# Patient Record
Sex: Male | Born: 1990 | Race: Black or African American | Hispanic: No | Marital: Married | Smoking: Never smoker
Health system: Southern US, Community
[De-identification: ages and names within clinical notes are randomized; demographics above are authoritative.]

## PROBLEM LIST (undated history)

## (undated) DIAGNOSIS — N200 Calculus of kidney: Secondary | ICD-10-CM

## (undated) HISTORY — DX: Calculus of kidney: N20.0

---
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
## 2014-07-09 ENCOUNTER — Emergency Department (HOSPITAL_COMMUNITY): Payer: Self-pay

## 2014-07-09 ENCOUNTER — Encounter (HOSPITAL_COMMUNITY): Payer: Self-pay | Admitting: Emergency Medicine

## 2014-07-09 ENCOUNTER — Emergency Department (HOSPITAL_COMMUNITY)
Admission: EM | Admit: 2014-07-09 | Discharge: 2014-07-09 | Disposition: A | Discharge status: Home / Self Care | Payer: Self-pay | Attending: Emergency Medicine | Admitting: Emergency Medicine

## 2014-07-09 DIAGNOSIS — Y92838 Other recreation area as the place of occurrence of the external cause: Secondary | ICD-10-CM

## 2014-07-09 DIAGNOSIS — W219XXA Striking against or struck by unspecified sports equipment, initial encounter: Secondary | ICD-10-CM | POA: Insufficient documentation

## 2014-07-09 DIAGNOSIS — S6992XA Unspecified injury of left wrist, hand and finger(s), initial encounter: Secondary | ICD-10-CM

## 2014-07-09 DIAGNOSIS — Y9367 Activity, basketball: Secondary | ICD-10-CM | POA: Insufficient documentation

## 2014-07-09 DIAGNOSIS — S6980XA Other specified injuries of unspecified wrist, hand and finger(s), initial encounter: Secondary | ICD-10-CM | POA: Insufficient documentation

## 2014-07-09 DIAGNOSIS — Y9239 Other specified sports and athletic area as the place of occurrence of the external cause: Secondary | ICD-10-CM | POA: Insufficient documentation

## 2014-07-09 DIAGNOSIS — S6990XA Unspecified injury of unspecified wrist, hand and finger(s), initial encounter: Secondary | ICD-10-CM | POA: Insufficient documentation

## 2014-07-09 MED ORDER — IBUPROFEN 800 MG PO TABS: 800.0000 mg | ORAL_TABLET | Freq: Three times a day (TID) | ORAL | Status: DC

## 2014-07-09 NOTE — Discharge Instructions (Signed)
Take ibuprofen as needed for pain. Rest, ice and elevate the left hand for pain relief. Refer to attached documents for more information.

## 2014-07-09 NOTE — ED Provider Notes (Signed)
CSN: [id number]     Arrival date & time 07/09/14  0940 History  This chart was scribed for non-physician practitioner Emilia Beck, PA-C working with Doug Sou, MD by Leone Payor, ED Scribe. This patient was seen in room TR09C/TR09C and the patient's care was started at 10:10 AM.    Chief Complaint  Patient presents with  . thumb pain     The history is provided by the patient. No language interpreter was used.    HPI Comments: Douglas Frank is a 23 y.o. male who presents to the Emergency Department complaining of a left thumb injury that occurred yesterday. Patient states he was playing basketball when he hyperextended his left thumb. He reports constant, unchanged left hand pain which is worse with movement. He denies weakness and numbness. He states his job requires him to lift boxes and his employer suggested he be evaluated and get a work note for today.   History reviewed. No pertinent past medical history. History reviewed. No pertinent past surgical history. No family history on file. History  Substance Use Topics  . Smoking status: Never Smoker   . Smokeless tobacco: Not on file  . Alcohol Use: Yes    Review of Systems  Musculoskeletal: Positive for arthralgias.  Neurological: Negative for weakness and numbness.  All other systems reviewed and are negative.     Allergies  Review of patient's allergies indicates no known allergies.  Home Medications   Prior to Admission medications   Not on File   BP 122/68  Pulse 74  Temp(Src) 98.4 F (36.9 C) (Oral)  Resp 18  Ht  (1.727 m)  Wt 150 lb (68.04 kg)  BMI 22.81 kg/m2  SpO2 98% Physical Exam  Nursing note and vitals reviewed. Constitutional: He is oriented to person, place, and time. He appears well-developed and well-nourished.  HENT:  Head: Normocephalic and atraumatic.  Cardiovascular: Normal rate.   Pulmonary/Chest: Effort normal.  Abdominal: He exhibits no distension.   Musculoskeletal:  Tenderness and swelling to the left thenar aspect of left hand. No obvious deformity. Slightly limited ROM of the left thumb due to pain. No left thumb tenderness to palpation.   Neurological: He is alert and oriented to person, place, and time.  Left thumb sensation intact.   Skin: Skin is warm and dry.  Psychiatric: He has a normal mood and affect.    ED Course  Procedures (including critical care time)  DIAGNOSTIC STUDIES: Oxygen Saturation is 98% on RA, normal by my interpretation.    COORDINATION OF CARE: 10:12 AM Will order XRAY of left hand. Discussed treatment plan with pt at bedside and pt agreed to plan.   Labs Review Labs Reviewed - No data to display  Imaging Review Dg Hand Complete Left  07/09/2014   CLINICAL DATA:  Injury to the left hand and thumb while playing ball last night.  EXAM: LEFT HAND - COMPLETE 3+ VIEW  COMPARISON:  None.  FINDINGS: There is subtle radiolucency at the base of the first distal phalanx, if the patient has focal pain here, nondisplaced fracture is not excluded. There is no dislocation. No other fracture is identified. The soft tissues are normal P  IMPRESSION: There is subtle radiolucency at the base of the first distal phalanx, if the patient has focal pain here, nondisplaced fracture is not excluded.   Electronically Signed   By: Sherian Rein M.D.   On: 07/09/2014 10:20     EKG Interpretation None  MDM   Final diagnoses:  Thumb injury, left, initial encounter    Patient's xray shows no acute fracture. Patient is only tender at the left thenar aspect and not the distal portion of the digit. Patient instructed to rest, ice and elevate for symptom relief. Patient will have ibuprofen for pain.   I personally performed the services described in this documentation, which was scribed in my presence. The recorded information has been reviewed and is accurate.   Emilia Beck, PA-C 07/09/14 [address] PA-C 07/09/14 1120

## 2014-07-09 NOTE — ED Notes (Signed)
Patient states was playing basketball last night and hurt L thumb and bottom part of hand.   Patient states hes unsure if he broke.  Swelling and pain.

## 2014-07-09 NOTE — ED Provider Notes (Signed)
Medical screening examination/treatment/procedure(s) were performed by non-physician practitioner and as supervising physician I was immediately available for consultation/collaboration.   EKG Interpretation None       Doug Sou, MD 07/09/14 1616

## 2016-11-20 ENCOUNTER — Ambulatory Visit: Payer: Self-pay

## 2018-10-19 ENCOUNTER — Encounter: Payer: Self-pay | Admitting: Urology

## 2018-10-19 ENCOUNTER — Ambulatory Visit (INDEPENDENT_AMBULATORY_CARE_PROVIDER_SITE_OTHER): Payer: Self-pay | Admitting: Urology

## 2018-10-19 VITALS — BP 119/85 | HR 72 | Ht 68.0 in | Wt 168.5 lb

## 2018-10-19 DIAGNOSIS — M79652 Pain in left thigh: Secondary | ICD-10-CM

## 2018-10-19 NOTE — Progress Notes (Signed)
10/19/2018 2:01 PM   Douglas Frank 11/04/1990 [id number]  Referring provider: No referring provider defined for this encounter.  Chief Complaint  Patient presents with  . Establish Care  . Groin Pain    HPI: 28 year old male presents for evaluation of a "strained groin".  He works for [employer] and in September 2019 states he picked up a heavy box and while shifting to a different position had pain in his left posterior upper thigh region.  His pain is worse with prolonged standing and walking.  He has been treated by his PCP with meloxicam and Robaxin but has dull persistent symptoms.  He has no voiding complaints.  No previous history of urologic problems.   PMH: No past medical history on file.  Surgical History: Past Surgical History:  Procedure Laterality Date  . COLONOSCOPY  2011    Home Medications:  Allergies as of 10/19/2018   No Known Allergies     Medication List       Accurate as of October 19, 2018  2:01 PM. Always use your most recent med list.        ibuprofen 800 MG tablet Commonly known as:  ADVIL,MOTRIN Take 1 tablet (800 mg total) by mouth 3 (three) times daily.   MELOXICAM PO Take by mouth.   METHOCARBAMOL PO Take by mouth.       Allergies: No Known Allergies  Family History: Family History  Problem Relation Age of Onset  . Prostate cancer Neg Hx   . Kidney cancer Neg Hx   . Kidney failure Neg Hx   . Sickle cell anemia Neg Hx   . Tuberculosis Neg Hx     Social History:  reports that he has never smoked. He has never used smokeless tobacco. He reports current alcohol use. He reports that he does not use drugs.  ROS: UROLOGY Frequent Urination?: No Hard to postpone urination?: No Burning/pain with urination?: No Get up at night to urinate?: No Leakage of urine?: No Urine stream starts and stops?: No Trouble starting stream?: No Do you have to strain to urinate?: No Blood in urine?: No Urinary tract infection?: No Sexually  transmitted disease?: No Injury to kidneys or bladder?: No Painful intercourse?: No Weak stream?: No Erection problems?: No Penile pain?: Yes  Gastrointestinal Nausea?: No Vomiting?: No Indigestion/heartburn?: No Diarrhea?: No Constipation?: No  Constitutional Fever: No Night sweats?: No Weight loss?: No Fatigue?: No  Skin Skin rash/lesions?: No Itching?: No  Eyes Blurred vision?: No Double vision?: No  Ears/Nose/Throat Sore throat?: No Sinus problems?: No  Hematologic/Lymphatic Swollen glands?: No Easy bruising?: No  Cardiovascular Leg swelling?: No Chest pain?: No  Respiratory Cough?: No Shortness of breath?: No  Endocrine Excessive thirst?: No  Musculoskeletal Back pain?: No Joint pain?: No  Neurological Headaches?: Yes Dizziness?: Yes  Psychologic Depression?: No Anxiety?: No  Physical Exam: BP 119/85 (BP Location: Left Arm, Patient Position: Sitting, Cuff Size: Normal)   Pulse 72   Ht 5\' 8"  (1.727 m)   Wt 168 lb 8 oz (76.4 kg)   BMI 25.62 kg/m   Constitutional:  Alert and oriented, No acute distress. HEENT: [place] AT, moist mucus membranes.  Trachea midline, no masses. Cardiovascular: No clubbing, cyanosis, or edema. Respiratory: Normal respiratory effort, no increased work of breathing. GI: Abdomen is soft, nontender, nondistended, no abdominal masses GU: No CVA tenderness.  Penis circumcised without lesions.  Testes descended bilaterally without masses or tenderness.  Cord/epididymis palpably normal. Skin: No rashes, bruises or suspicious lesions.  Musculoskeletal: The region of his discomfort is in the upper medial thigh posteriorly.  No palpable tenderness or mass. Neurologic: Grossly intact, no focal deficits, moving all 4 extremities. Psychiatric: Normal mood and affect.   Assessment & Plan:   28 year old male with musculoskeletal pain that has been persistent.  No significant improvement with muscle relaxants and NSAIDs.  I  informed him that urologists typically do not treat musculoskeletal symptoms in this area.  I did put in a physical therapy referral and if he has persistent symptoms would recommend he see an orthopedic specialist.  Follow-up as needed.   Riki Altes, MD  [facility] [address] [place], [place] [address] [phone]

## 2018-11-09 ENCOUNTER — Telehealth: Payer: Self-pay | Admitting: Urology

## 2018-11-09 ENCOUNTER — Encounter: Payer: Self-pay | Admitting: Physical Therapy

## 2018-11-09 ENCOUNTER — Ambulatory Visit: Payer: 59 | Attending: Urology | Admitting: Physical Therapy

## 2018-11-09 ENCOUNTER — Other Ambulatory Visit: Payer: Self-pay

## 2018-11-09 DIAGNOSIS — M25551 Pain in right hip: Secondary | ICD-10-CM

## 2018-11-09 DIAGNOSIS — R252 Cramp and spasm: Secondary | ICD-10-CM

## 2018-11-09 DIAGNOSIS — M6281 Muscle weakness (generalized): Secondary | ICD-10-CM | POA: Diagnosis present

## 2018-11-09 NOTE — Telephone Encounter (Signed)
Received a call today from [place] at [facility] PT dept. She stated that the patient told them that his pain was in his right thigh not his left as the chart notes stated. She asked that I change the referral to reflect this. I changed it per her request. Just wanted to make you aware. Nothing else needs to be done, just wanted you to know.   Thanks, Marcelino Duster

## 2018-11-09 NOTE — Therapy (Addendum)
[facility] [address] [place], [place], [address] Phone: [phone]   Fax:  [phone]  Physical Therapy Evaluation  Patient Details  Name: Douglas Frank MRN: [id number] Date of Birth: May 25, 1991 Referring Provider (PT): Dr. Irineo Axon   Encounter Date: 11/09/2018  PT End of Session - 11/09/18 1201    Visit Number  1    Date for PT Re-Evaluation  12/21/18    Authorization Type  [employer]    PT Start Time  1100    PT Stop Time  1155    PT Time Calculation (min)  55 min    Activity Tolerance  Patient tolerated treatment well;No increased pain    Behavior During Therapy  [facility] for tasks assessed/performed       History reviewed. No pertinent past medical history.  Past Surgical History:  Procedure Laterality Date  . COLONOSCOPY  2011    There were no vitals filed for this visit.   Subjective Assessment - 11/09/18 1106    Subjective  Patient reports he started to have pain 06/2018 while lifting a 60# box. Patient picked it up and twisted spine while throwing it into the right place. Patient was checked for a hernia and cleared. Patient will be seeing a general surgeon for the pain.  Intermittent sharp pain into penis and down right side.     Patient Stated Goals  reduce pain, go back to gym, lift things,     Currently in Pain?  Yes    Pain Score  4     Pain Location  Groin    Pain Orientation  Right    Pain Descriptors / Indicators  Tightness    Pain Type  Acute pain    Pain Onset  More than a month ago    Pain Frequency  Intermittent    Aggravating Factors   sitting long period, walking, lifting, laying on stomach, laying on either side    Pain Relieving Factors  laying on back    Multiple Pain Sites  No         [facility] PT Assessment - 11/09/18 0001      Assessment   Medical Diagnosis  [id number] Musculoskeletal pain of right thigh    Referring Provider (PT)  Dr. Irineo Axon    Onset  Date/Surgical Date  06/18/18    Prior Therapy  none      Precautions   Precautions  None      Restrictions   Weight Bearing Restrictions  No      Balance Screen   Has the patient fallen in the past 6 months  No    Has the patient had a decrease in activity level because of a fear of falling?   No    Is the patient reluctant to leave their home because of a fear of falling?   No      Home Public house manager residence      Prior Function   Level of Independence  Independent    Vocation  Full time employment    Vocation Requirements  sitting right now, past job was lifting    Leisure  basketball, gym      Cognition   Overall Cognitive Status  Within Functional Limits for tasks assessed      Posture/Postural Control   Posture/Postural Control  No significant limitations      ROM / Strength   AROM / PROM / Strength  AROM;PROM;Strength      AROM   Lumbar Flexion  full with slight deviation to the right    Lumbar Extension  decreased by 50%    Lumbar - Right Side Bend  decreased by 25%    Lumbar - Left Side Bend  decreased by 25%      PROM   Right Hip External Rotation   55    Right Hip Internal Rotation   20    Left Hip External Rotation   70    Left Hip Internal Rotation   30      Strength   Right Hip External Rotation   3+/5   pain in groin   Right Hip Internal Rotation  4/5    Right Hip ABduction  3+/5    Right Hip ADduction  3+/5   pain   Left Hip External Rotation  5/5    Left Hip Internal Rotation  5/5    Left Hip ABduction  4-/5    Left Hip ADduction  5/5      Palpation   Spinal mobility  decreased L4-L5 vertebrae    SI assessment   right ilium is anteriorly rotated, sacrum rotated left    Palpation comment  tenderness in the hip adductors right, right quadriceps                Objective measurements completed on examination: See above findings.      [facility] Adult PT Treatment/Exercise - 11/09/18 0001      Knee/Hip  Exercises: Stretches   Active Hamstring Stretch  Right;1 rep;30 seconds    Active Hamstring Stretch Limitations  with strap in supine; then hip adductor with a strap in supine    Hip Flexor Stretch  Right;1 rep;30 seconds    Hip Flexor Stretch Limitations  with strap in sidely      Manual Therapy   Manual Therapy  Soft tissue mobilization;Joint mobilization;Muscle Energy Technique    Joint Mobilization  mobilization to sacrum and L4-L5     Soft tissue mobilization  right quadriceps and hip adductors    Muscle Energy Technique  correct right ilium       Trigger Point Dry Needling - 11/09/18 1158    Consent Given?  Yes    Education Handout Provided  Yes    Muscles Treated Lower Body  Adductor longus/brevius/maximus;Quadriceps   right   Quadriceps Response  Twitch response elicited;Palpable increased muscle length    Adductor Response  Twitch response elicited;Palpable increased muscle length           PT Education - 11/09/18 1157    Education Details  Access Code: [email or web address] ; information on dry needling    Person(s) Educated  Patient    Methods  Explanation;Demonstration;Verbal cues;Handout    Comprehension  Returned demonstration;Verbalized understanding       PT Short Term Goals - 11/09/18 1207      PT SHORT TERM GOAL #1   Title  Patient independent with initial HEP    Time  3    Period  Weeks    Status  New    Target Date  11/30/18      PT SHORT TERM GOAL #2   Title  ability to walk without a limp due to pelvis in correct alignment and pain decreased >/= 25%    Time  3    Period  Weeks    Status  New    Target Date  11/30/18  PT Long Term Goals - 11/09/18 1208      PT LONG TERM GOAL #1   Title  independent with HEP and understand how to progress himself at the gym    Time  6    Period  Weeks    Status  New    Target Date  12/21/18      PT LONG TERM GOAL #2   Title  able to lift items with correct body mechanics with pain due to reduction of  trigger points in the hip adductors and quadricep    Time  3    Period  Weeks    Status  New    Target Date  12/21/18      PT LONG TERM GOAL #3   Title  full lumbar ROM so patient is able to lay on his stomach and side without pain    Time  6    Period  Weeks    Status  New    Target Date  12/21/18      PT LONG TERM GOAL #4   Title  bilateral hip strength is >/=4/5 so patient is able to walk without pain or limp    Time  6    Period  Weeks    Status  New    Target Date  12/21/18      PT LONG TERM GOAL #5   Title  sit without difficulty or pain due to improved fascial restrictions in the right thigh    Time  6    Period  Weeks    Status  New    Target Date  12/21/18             Plan - 11/09/18 1201    Clinical Impression Statement  Patient is a 28 year old male with right groin pain since 06/2018. Patient reports he was picking up a 60# box when he twisted to place box in proper place. Patient started to have right groin pain at level 4/10. Patient reports his pain is worse with laying on his side or stomach, sitting and walking, and lifting. Patient has difficulty with walking due to limp on the right.  Right ilium is rotated anteriorly and sacrum rotated left. L4-L5 has decreased mobility. Patient has palpable tendenress located on the right hip adductors, quadrieps. Patient has weakness in bilateral hips. Patient has decreased ROM of right hip for rotation. Lumbar ROM is limited with trunk flexion deviates to the right. Patient will benefit from skilled therapy to improve ROM, decreased pain, reduce fascial restrictions to restore function.     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     Clinical Decision Making  Low    Rehab Potential  Excellent    Clinical Impairments Affecting Rehab Potential   none    PT Frequency  2x / week    PT Duration  6 weeks    PT Treatment/Interventions  Iontophoresis 4mg /ml Dexamethasone;Moist Heat;Ultrasound;Cryotherapy;Gait training;Therapeutic activities;Therapeutic exercise;Neuromuscular re-education;Manual techniques;Patient/family education;Passive range of motion;Dry needling    PT Next Visit Plan  assess dry needling, see if ilium in correct position, strength of right hip, joint mobilization of right hip, hip ER stretches    PT Home Exercise Plan  Access Code: [email or web address]    Consulted and Agree with  Plan of Care  Patient       Patient will benefit from skilled therapeutic intervention in order to improve the following deficits and impairments:  Increased fascial restricitons, Pain, Decreased mobility, Increased muscle spasms, Decreased activity tolerance, Decreased endurance, Decreased range of motion, Decreased strength, Difficulty walking  Visit Diagnosis: Pain in right hip - Plan: PT plan of care cert/re-cert  Muscle weakness (generalized) - Plan: PT plan of care cert/re-cert  Cramp and spasm - Plan: PT plan of care cert/re-cert     Problem List There are no active problems to display for this patient.   Eulis FosterCheryl Chablis Losh, PT 11/09/18 2:03 PM   [facility] [address], [address] [place], [place], [address] Phone: [phone]   Fax:  [phone]  Name: Douglas Frank MRN: [id number] Date of Birth: 02-01-1991

## 2018-11-09 NOTE — Telephone Encounter (Signed)
Noted, thanks!

## 2018-11-09 NOTE — Patient Instructions (Signed)
Access Code: [email or web address]  URL: [email or web address]  Date: 11/09/2018  Prepared by: Eulis Foster   Exercises  Hip Adductors and Hamstring Stretch with Strap - 2 reps - 1 sets - 30 sec hold - 1x daily - 7x weekly  Hooklying Hamstring Stretch with Strap - 2 reps - 1 sets - 30 sec hold - 2x daily - 7x weekly  Sidelying Quadriceps Stretch with Strap - 2 reps - 1 sets - 30 sec hold - 3x daily - 7x weekly  Patient Education  Trigger [facility] [address], [address] [place], [place] [address] Phone # [phone] Fax [phone]

## 2018-11-09 NOTE — Addendum Note (Signed)
Addended by: Eulis FosterGRAY, CHERYL F on: 11/09/2018 02:06 PM   Modules accepted: Orders

## 2018-11-13 ENCOUNTER — Encounter: Payer: Self-pay | Admitting: Physical Therapy

## 2018-11-13 ENCOUNTER — Ambulatory Visit: Payer: 59 | Admitting: Physical Therapy

## 2018-11-13 DIAGNOSIS — M6281 Muscle weakness (generalized): Secondary | ICD-10-CM

## 2018-11-13 DIAGNOSIS — M25551 Pain in right hip: Secondary | ICD-10-CM | POA: Diagnosis not present

## 2018-11-13 DIAGNOSIS — R252 Cramp and spasm: Secondary | ICD-10-CM

## 2018-11-13 NOTE — Patient Instructions (Signed)
Access Code: [email or web address]  URL: [email or web address]  Date: 11/13/2018  Prepared by: Eulis Foster   Exercises  Hip Adductors and Hamstring Stretch with Strap - 2 reps - 1 sets - 30 sec hold - 1x daily - 7x weekly  Hooklying Hamstring Stretch with Strap - 2 reps - 1 sets - 30 sec hold - 2x daily - 7x weekly  Sidelying Quadriceps Stretch with Strap - 2 reps - 1 sets - 30 sec hold - 3x daily - 7x weekly  Supine Piriformis Stretch Pulling Heel to Hip - 2 reps - 1 sets - 30 sec hold - 1x daily - 7x weekly  Seated Piriformis Stretch with Trunk Bend - 2 reps - 1 sets - 30 sec hold - 1x daily - 7x weekly  Clamshell - 20 reps - 1 sets - 1 sec hold - 1x daily - 7x weekly  Bridge - 15 reps - 1 sets - 5 sec hold - 1x daily - 7x weekly  Patient Education  Trigger [facility] [address] [place], [place] [address] Phone # [phone] Fax [phone]

## 2018-11-13 NOTE — Therapy (Signed)
[facility] [address], [address] [place], [place], [address] Phone: [phone]   Fax:  [phone]  Physical Therapy Treatment  Patient Details  Name: Douglas Frank MRN: [id number] Date of Birth: March 26, 1991 Referring Provider (PT): Dr. Irineo AxonScott Stoioff   Encounter Date: 11/13/2018  PT End of Session - 11/13/18 0804    Visit Number  2    Date for PT Re-Evaluation  12/21/18    Authorization Type  Aetna    PT Start Time  0800    PT Stop Time  0840    PT Time Calculation (min)  40 min    Activity Tolerance  Patient tolerated treatment well;No increased pain    Behavior During Therapy  [facility] for tasks assessed/performed       History reviewed. No pertinent past medical history.  Past Surgical History:  Procedure Laterality Date  . COLONOSCOPY  2011    There were no vitals filed for this visit.  Subjective Assessment - 11/13/18 0803    Subjective  The tightness is better. Patient wife has been massaging the area.     Patient Stated Goals  reduce pain, go back to gym, lift things,     Currently in Pain?  Yes    Pain Score  3     Pain Location  Groin    Pain Orientation  Right    Pain Descriptors / Indicators  Tightness    Pain Type  Acute pain    Pain Onset  More than a month ago    Pain Frequency  Intermittent    Aggravating Factors   sitting long period, walking, lifting, laying on stomach, laying on either side    Pain Relieving Factors  laying on back    Multiple Pain Sites  No                       [facility] Adult PT Treatment/Exercise - 11/13/18 0001      Exercises   Exercises  Knee/Hip      Knee/Hip Exercises: Aerobic   Stationary Bike  5 min level 5 while assessing patient    Tread Mill  walking 3 min at 2.5 mph and 3 min at 3 mph to correct gait      Manual Therapy   Manual Therapy  Soft tissue mobilization;Joint mobilization    Manual therapy comments  using addaday to right hip  adductors, hamstring and quads    Joint Mobilization  right hip for inferior glide, lateral glide and distraction       Trigger Point Dry Needling - 11/13/18 [id number]    Consent Given?  Yes    Muscles Treated Lower Body  Quadriceps;Adductor longus/brevius/maximus;Hamstring   right   Quadriceps Response  Twitch response elicited;Palpable increased muscle length    Adductor Response  Twitch response elicited;Palpable increased muscle length    Hamstring Response  Twitch response elicited;Palpable increased muscle length           PT Education - 11/13/18 0839    Education Details  Access Code: [email or web address]     Person(s) Educated  Patient    Methods  Explanation;Demonstration;Verbal cues;Handout    Comprehension  Returned demonstration;Verbalized understanding       PT Short Term Goals - 11/09/18 1207      PT SHORT TERM GOAL #1   Title  Patient independent with initial HEP    Time  3    Period  Weeks    Status  New    Target Date  11/30/18      PT SHORT TERM GOAL #2   Title  ability to walk without a limp due to pelvis in correct alignment and pain decreased >/= 25%    Time  3    Period  Weeks    Status  New    Target Date  11/30/18        PT Long Term Goals - 11/09/18 1208      PT LONG TERM GOAL #1   Title  independent with HEP and understand how to progress himself at the gym    Time  6    Period  Weeks    Status  New    Target Date  12/21/18      PT LONG TERM GOAL #2   Title  able to lift items with correct body mechanics with pain due to reduction of trigger points in the hip adductors and quadricep    Time  3    Period  Weeks    Status  New    Target Date  12/21/18      PT LONG TERM GOAL #3   Title  full lumbar ROM so patient is able to lay on his stomach and side without pain    Time  6    Period  Weeks    Status  New    Target Date  12/21/18      PT LONG TERM GOAL #4   Title  bilateral hip strength is >/=4/5 so patient is able to walk without pain or  limp    Time  6    Period  Weeks    Status  New    Target Date  12/21/18      PT LONG TERM GOAL #5   Title  sit without difficulty or pain due to improved fascial restrictions in the right thigh    Time  6    Period  Weeks    Status  New    Target Date  12/21/18            Plan - 11/13/18 0804    Clinical Impression Statement  Patient continues to have trigger points in the right quads, hamstring, and hip adductors. Patient able to walk without deficits when walking on the treadmill at 2.5 and 3 mph. Patient has pain with intercourse due to pain in right groin and hamstring. Patient will walk guarded on right hip. Patient had increased right hip ER after therapy. Patient is progressing his hip stretches and strength. Patient will benefit from skilled therapy to improve ROM, decreased pain, reduce fascial restrictions, reduce fascial restrictions to restore function.      Rehab Potential  Excellent    Clinical Impairments Affecting Rehab Potential  none    PT Frequency  2x / week    PT Duration  6 weeks    PT Treatment/Interventions  Iontophoresis 4mg /ml Dexamethasone;Moist Heat;Ultrasound;Cryotherapy;Gait training;Therapeutic activities;Therapeutic exercise;Neuromuscular re-education;Manual techniques;Patient/family education;Passive range of motion;Dry needling    PT Next Visit Plan   see if ilium in correct position, strength of right hip, joint mobilization of right hip, hip ER stretches; treadmill, stance strengthening,     PT Home Exercise Plan  Access Code: [email or web address]    Recommended Other Services  MD signed the initial note    Consulted and Agree with Plan of Care  Patient       Patient will benefit from skilled therapeutic intervention in order to improve  the following deficits and impairments:  Increased fascial restricitons, Pain, Decreased mobility, Increased muscle spasms, Decreased activity tolerance, Decreased endurance, Decreased range of motion, Decreased strength,  Difficulty walking  Visit Diagnosis: Pain in right hip  Muscle weakness (generalized)  Cramp and spasm     Problem List There are no active problems to display for this patient.   Eulis Foster, PT 11/13/18 8:47 AM   [facility] [address] [place], [place], [address] Phone: [phone]   Fax:  [phone]  Name: Douglas Frank MRN: [id number] Date of Birth: [birth year]

## 2018-11-15 ENCOUNTER — Encounter: Payer: Self-pay | Admitting: Physical Therapy

## 2018-11-15 ENCOUNTER — Ambulatory Visit: Payer: 59 | Admitting: Physical Therapy

## 2018-11-15 DIAGNOSIS — M6281 Muscle weakness (generalized): Secondary | ICD-10-CM

## 2018-11-15 DIAGNOSIS — R252 Cramp and spasm: Secondary | ICD-10-CM

## 2018-11-15 DIAGNOSIS — M25551 Pain in right hip: Secondary | ICD-10-CM

## 2018-11-15 NOTE — Therapy (Signed)
[facility] [address] [place], [place], [address] Phone: [phone]   Fax:  [phone]  Physical Therapy Treatment  Patient Details  Name: Douglas Frank MRN: [id number] Date of Birth: Apr 29, 1991 Referring Provider (PT): Dr. Irineo Axon   Encounter Date: 11/15/2018  PT End of Session - 11/15/18 1407    Visit Number  3    Date for PT Re-Evaluation  12/21/18    Authorization Type  Aetna    PT Start Time  1403    PT Stop Time  1451    PT Time Calculation (min)  48 min    Activity Tolerance  Patient tolerated treatment well;No increased pain    Behavior During Therapy  [facility] for tasks assessed/performed       History reviewed. No pertinent past medical history.  Past Surgical History:  Procedure Laterality Date  . COLONOSCOPY  2011    There were no vitals filed for this visit.  Subjective Assessment - 11/15/18 1408    Subjective  Pain is a constant, annoying, nagging ache.     Currently in Pain?  Yes    Pain Score  3     Pain Location  Groin    Pain Orientation  Right;Posterior    Pain Descriptors / Indicators  Aching    Multiple Pain Sites  No                       [facility] Adult PT Treatment/Exercise - 11/15/18 0001      Knee/Hip Exercises: Stretches   Active Hamstring Stretch  Right;3 reps;30 seconds   with strap   Other Knee/Hip Stretches  IR/ER stretch Rt 30 sec each     Other Knee/Hip Stretches  adductor stretch with strap 3x 30 sec      Knee/Hip Exercises: Aerobic   Stationary Bike  L2 x 5 min with concurrent review of status      Knee/Hip Exercises: Supine   Bridges  --   3x then stopped for pain, f/b SKC chest Bil   Knee Flexion Limitations  Isometric hip extension 3 sec holds x 8     Other Supine Knee/Hip Exercises  Ball squeezes for hip add: 5 sec hold 2x 5     Other Supine Knee/Hip Exercises  Hip abd with green band 2x5       Knee/Hip Exercises: Sidelying   Hip  ABduction  AROM;Strengthening;Right;2 sets   5 reps 2 sets     Cryotherapy   Number Minutes Cryotherapy  10 Minutes   post session   Cryotherapy Location  --   Posterior high hamstring and medial RT groin   Type of Cryotherapy  Ice pack               PT Short Term Goals - 11/09/18 1207      PT SHORT TERM GOAL #1   Title  Patient independent with initial HEP    Time  3    Period  Weeks    Status  New    Target Date  11/30/18      PT SHORT TERM GOAL #2   Title  ability to walk without a limp due to pelvis in correct alignment and pain decreased >/= 25%    Time  3    Period  Weeks    Status  New    Target Date  11/30/18        PT Long Term Goals -  11/09/18 1208      PT LONG TERM GOAL #1   Title  independent with HEP and understand how to progress himself at the gym    Time  6    Period  Weeks    Status  New    Target Date  12/21/18      PT LONG TERM GOAL #2   Title  able to lift items with correct body mechanics with pain due to reduction of trigger points in the hip adductors and quadricep    Time  3    Period  Weeks    Status  New    Target Date  12/21/18      PT LONG TERM GOAL #3   Title  full lumbar ROM so patient is able to lay on his stomach and side without pain    Time  6    Period  Weeks    Status  New    Target Date  12/21/18      PT LONG TERM GOAL #4   Title  bilateral hip strength is >/=4/5 so patient is able to walk without pain or limp    Time  6    Period  Weeks    Status  New    Target Date  12/21/18      PT LONG TERM GOAL #5   Title  sit without difficulty or pain due to improved fascial restrictions in the right thigh    Time  6    Period  Weeks    Status  New    Target Date  12/21/18            Plan - 11/15/18 1419    Clinical Impression Statement  Pt arrives from work ( not lifting) and he is sore from walking. We introduced light Rt hip strengthening today after performing his stretches encouraging pt to go slow and  work in pain free ROM. He could do this, but these basiic exercises made him pretty sore and we were not not able to progress to standing today. Used ice at end of treatment for soreness.     Rehab Potential  Excellent    Clinical Impairments Affecting Rehab Potential  none    PT Frequency  2x / week    PT Duration  6 weeks    PT Treatment/Interventions  Iontophoresis 4mg /ml Dexamethasone;Moist Heat;Ultrasound;Cryotherapy;Gait training;Therapeutic activities;Therapeutic exercise;Neuromuscular re-education;Manual techniques;Patient/family education;Passive range of motion;Dry needling    PT Next Visit Plan  If pt tolerated the beginning exercises today for strength ok, give for HEP next session.     PT Home Exercise Plan  Access Code: [email or web address]    Consulted and Agree with Plan of Care  Patient       Patient will benefit from skilled therapeutic intervention in order to improve the following deficits and impairments:  Increased fascial restricitons, Pain, Decreased mobility, Increased muscle spasms, Decreased activity tolerance, Decreased endurance, Decreased range of motion, Decreased strength, Difficulty walking  Visit Diagnosis: Pain in right hip  Muscle weakness (generalized)  Cramp and spasm     Problem List There are no active problems to display for this patient.   Douglas Frank, PTA 11/15/2018, 2:43 PM  [facility] [address] [place], [place], [address] Phone: [phone]   Fax:  [phone]  Name: Douglas Frank MRN: [id number] Date of Birth: Nov 14, 1990

## 2018-11-20 ENCOUNTER — Ambulatory Visit: Payer: 59 | Attending: Urology | Admitting: Physical Therapy

## 2018-11-20 ENCOUNTER — Encounter: Payer: Self-pay | Admitting: Physical Therapy

## 2018-11-20 DIAGNOSIS — M6281 Muscle weakness (generalized): Secondary | ICD-10-CM

## 2018-11-20 DIAGNOSIS — M25551 Pain in right hip: Secondary | ICD-10-CM | POA: Diagnosis present

## 2018-11-20 DIAGNOSIS — R252 Cramp and spasm: Secondary | ICD-10-CM | POA: Diagnosis present

## 2018-11-20 NOTE — Therapy (Signed)
[facility] [address] [place], [place], [address] Phone: [phone]   Fax:  [phone]  Physical Therapy Treatment  Patient Details  Name: Josie Mesa MRN: [id number] Date of Birth: [birth year] Referring Provider (PT): Dr. John Giovanni   Encounter Date: 11/20/2018  PT End of Session - 11/20/18 0807    Visit Number  4    Date for PT Re-Evaluation  12/21/18    Authorization Type  Aetna    PT Start Time  0804    PT Stop Time  9024    PT Time Calculation (min)  51 min    Activity Tolerance  Patient tolerated treatment well;No increased pain    Behavior During Therapy  [facility] for tasks assessed/performed       History reviewed. No pertinent past medical history.  Past Surgical History:  Procedure Laterality Date  . COLONOSCOPY  2011    There were no vitals filed for this visit.  Subjective Assessment - 11/20/18 0809    Subjective  I am doing better. I feel good this AM.    Currently in Pain?  No/denies    Multiple Pain Sites  No                       [facility] Adult PT Treatment/Exercise - 11/20/18 0001      Knee/Hip Exercises: Stretches   Active Hamstring Stretch  Right;3 reps;30 seconds   with strap, LTLE straight today   Other Knee/Hip Stretches  IR/ER stretch Rt 30 sec each    2 sets   Other Knee/Hip Stretches  adductor stretch with strap 3x 30 sec      Knee/Hip Exercises: Aerobic   Stationary Bike  L2 x 7 min   PTA present to discuss status     Knee/Hip Exercises: Supine   Bridges  Strengthening;Both;2 sets;10 reps    Bridges with [employer]  Strengthening;Both;1 set;10 reps    Knee Flexion Limitations  Ball squeezex 10x    Other Supine Knee/Hip Exercises  clamshells red 2x10      Knee/Hip Exercises: Sidelying   Hip ABduction  AROM;Strengthening;Right;2 sets;10 reps    Clams  red band 2x10      Cryotherapy   Number Minutes Cryotherapy  10 Minutes   post session   Cryotherapy Location  --   RT groin and proximal hamstrings   Type of Cryotherapy  Ice pack               PT Short Term Goals - 11/20/18 0973      PT SHORT TERM GOAL #1   Title  Patient independent with initial HEP    Time  3    Period  Weeks    Status  Achieved      PT SHORT TERM GOAL #2   Title  ability to walk without a limp due to pelvis in correct alignment and pain decreased >/= 25%    Time  3    Period  Weeks    Status  Achieved        PT Long Term Goals - 11/20/18 5329      PT LONG TERM GOAL #5   Title  sit without difficulty or pain due to improved fascial restrictions in the right thigh    Time  6    Period  Weeks    Status  On-going   minimal difficulty left  Plan - 11/20/18 0807    Clinical Impression Statement  Pt arrives today without pain and reports of feeling 75% better since his evaluaiton. Pt has met all short term goals this AM. Pt did very well with resisted hip work today, added red band to his clamshells and ball squeeze to his bridge for home.  Pt also able to increase his volume for all his hip strengthening exercises.    Rehab Potential  Excellent    Clinical Impairments Affecting Rehab Potential  none    PT Frequency  2x / week    PT Duration  6 weeks    PT Treatment/Interventions  Iontophoresis 30m/ml Dexamethasone;Moist Heat;Ultrasound;Cryotherapy;Gait training;Therapeutic activities;Therapeutic exercise;Neuromuscular re-education;Manual techniques;Patient/family education;Passive range of motion;Dry needling    PT Next Visit Plan  DN if pt & PT feels there is a need. MMT RT hip for goals and ROM.     PT Home Exercise Plan  Access Code: [email or web address]    Consulted and Agree with Plan of Care  Patient       Patient will benefit from skilled therapeutic intervention in order to improve the following deficits and impairments:  Increased fascial restricitons, Pain, Decreased mobility, Increased muscle spasms, Decreased activity  tolerance, Decreased endurance, Decreased range of motion, Decreased strength, Difficulty walking  Visit Diagnosis: Pain in right hip  Muscle weakness (generalized)  Cramp and spasm     Problem List There are no active problems to display for this patient.   Angely Dietz, PTA 11/20/2018, 8:46 AM  [facility] [address] [place] [address]Phone: [phone]  Fax:  [phone] Name: CReyes AldacoMRN: [id number] of Birth: 121-Dec-1992

## 2018-11-22 ENCOUNTER — Encounter: Payer: Self-pay | Admitting: Physical Therapy

## 2018-11-22 ENCOUNTER — Ambulatory Visit: Payer: 59 | Admitting: Physical Therapy

## 2018-11-22 DIAGNOSIS — M25551 Pain in right hip: Secondary | ICD-10-CM | POA: Diagnosis not present

## 2018-11-22 DIAGNOSIS — R252 Cramp and spasm: Secondary | ICD-10-CM

## 2018-11-22 DIAGNOSIS — M6281 Muscle weakness (generalized): Secondary | ICD-10-CM

## 2018-11-22 NOTE — Therapy (Signed)
[facility] [address], [address] [place], [place], [address] Phone: [phone]   Fax:  [phone]  Physical Therapy Treatment  Patient Details  Name: Douglas Frank MRN: [id number] Date of Birth: 08/08/1991 Referring Provider (PT): Dr. Irineo AxonScott Stoioff   Encounter Date: 11/22/2018  PT End of Session - 11/22/18 1526    Visit Number  5    Date for PT Re-Evaluation  12/21/18    Authorization Type  Aetna    PT Start Time  1445    PT Stop Time  1525    PT Time Calculation (min)  40 min    Activity Tolerance  Patient tolerated treatment well;No increased pain    Behavior During Therapy  [facility] for tasks assessed/performed       History reviewed. No pertinent past medical history.  Past Surgical History:  Procedure Laterality Date  . COLONOSCOPY  2011    There were no vitals filed for this visit.  Subjective Assessment - 11/22/18 1447    Subjective  I feel better. I feel stronger. My pain feels 70% better. I have been mentally. I feel like I need dry needling.     Patient Stated Goals  reduce pain, go back to gym, lift things,     Currently in Pain?  No/denies    Multiple Pain Sites  No         [facility] PT Assessment - 11/22/18 0001      AROM   Lumbar Flexion  full no deviation    Lumbar Extension  full    Lumbar - Right Side Bend  full    Lumbar - Left Side Bend  full      PROM   Right Hip External Rotation   70    Right Hip Internal Rotation   25      Strength   Right Hip External Rotation   5/5    Right Hip Internal Rotation  5/5    Left Hip External Rotation  5/5    Left Hip Internal Rotation  5/5      Palpation   Spinal mobility  full lumbar mobility    SI assessment   pelvis in correct alignment                   [facility] Adult PT Treatment/Exercise - 11/22/18 0001      Knee/Hip Exercises: Aerobic   Stationary Bike  L2 x 7 min   PT present to discuss status     Manual Therapy   Manual  Therapy  Soft tissue mobilization    Manual therapy comments  used Addaday to right hamstring and hip adductors    Soft tissue mobilization  manual soft tissue work to the right hip adductors and hamstring       Trigger Point Dry Needling - 11/22/18 1451    Consent Given?  Yes    Muscles Treated Lower Body  Hamstring;Adductor longus/brevius/maximus    Adductor Response  Twitch response elicited;Palpable increased muscle length    Hamstring Response  Twitch response elicited;Palpable increased muscle length             PT Short Term Goals - 11/22/18 1515      PT SHORT TERM GOAL #1   Title  Patient independent with initial HEP    Time  3    Period  Weeks    Status  Achieved      PT SHORT TERM GOAL #2   Title  ability to walk without a limp due to pelvis in correct alignment and pain decreased >/= 25%    Time  3    Period  Weeks    Status  Achieved        PT Long Term Goals - 11/20/18 9741      PT LONG TERM GOAL #5   Title  sit without difficulty or pain due to improved fascial restrictions in the right thigh    Time  6    Period  Weeks    Status  On-going   minimal difficulty left           Plan - 11/22/18 1526    Clinical Impression Statement  Patient had trigger points in the right hamstring and hip adductor and iliopsoas. Patient pelvis was in correct alignment. Patient has full right hip P/ROM. Patient hip rotator strength is now 5/5. Patient is not walking with a limp. Patient needs strengthening for the pelvis and hip for daily tasks. Patient has full lumbar ROM now. Patient will benefit from skilled therapy to improve strength and function.     Rehab Potential  Excellent    Clinical Impairments Affecting Rehab Potential  none    PT Frequency  2x / week    PT Duration  6 weeks    PT Treatment/Interventions  Iontophoresis 4mg /ml Dexamethasone;Moist Heat;Ultrasound;Cryotherapy;Gait training;Therapeutic activities;Therapeutic exercise;Neuromuscular  re-education;Manual techniques;Patient/family education;Passive range of motion;Dry needling    PT Next Visit Plan  pelvic stability exercises, right hip strength    PT Home Exercise Plan  Access Code: [email or web address]    Consulted and Agree with Plan of Care  Patient       Patient will benefit from skilled therapeutic intervention in order to improve the following deficits and impairments:  Increased fascial restricitons, Pain, Decreased mobility, Increased muscle spasms, Decreased activity tolerance, Decreased endurance, Decreased range of motion, Decreased strength, Difficulty walking  Visit Diagnosis: Pain in right hip  Muscle weakness (generalized)  Cramp and spasm     Problem List There are no active problems to display for this patient.   Eulis Foster, PT 11/22/18 3:29 PM   [facility] [address] [place], [place], [address] Phone: [phone]   Fax:  [phone]  Name: Douglas Frank MRN: [id number] Date of Birth: 1991-04-03

## 2018-11-27 ENCOUNTER — Ambulatory Visit: Payer: 59 | Admitting: Physical Therapy

## 2018-11-27 ENCOUNTER — Encounter: Payer: Self-pay | Admitting: Physical Therapy

## 2018-11-27 DIAGNOSIS — M6281 Muscle weakness (generalized): Secondary | ICD-10-CM

## 2018-11-27 DIAGNOSIS — R252 Cramp and spasm: Secondary | ICD-10-CM

## 2018-11-27 DIAGNOSIS — M25551 Pain in right hip: Secondary | ICD-10-CM

## 2018-11-27 NOTE — Therapy (Signed)
[facility] [address], [address] [place], [place], [address] Phone: [phone]   Fax:  [phone]  Physical Therapy Treatment  Patient Details  Name: Douglas Frank MRN: [id number] Date of Birth: 1990/12/08 Referring Provider (PT): Dr. Irineo Axon   Encounter Date: 11/27/2018  PT End of Session - 11/27/18 1141    Visit Number  6    Date for PT Re-Evaluation  12/21/18    Authorization Type  Aetna    PT Start Time  1100    PT Stop Time  1138    PT Time Calculation (min)  38 min    Activity Tolerance  Patient tolerated treatment well;No increased pain    Behavior During Therapy  [facility] for tasks assessed/performed       History reviewed. No pertinent past medical history.  Past Surgical History:  Procedure Laterality Date  . COLONOSCOPY  2011    There were no vitals filed for this visit.  Subjective Assessment - 11/27/18 1109    Subjective  I felt tight but the bike loosen me up.     Patient Stated Goals  reduce pain, go back to gym, lift things,     Currently in Pain?  No/denies    Multiple Pain Sites  No                       [facility] Adult PT Treatment/Exercise - 11/27/18 0001      Lumbar Exercises: Quadruped   Opposite Arm/Leg Raise  Right arm/Left leg;Left arm/Right leg;10 reps;5 seconds   VC to not drop hip     Knee/Hip Exercises: Stretches   Active Hamstring Stretch  Right;1 rep;30 seconds   supine    Active Hamstring Stretch Limitations  then bring leg inward 30 sec and outward 30 sec      Knee/Hip Exercises: Aerobic   Stationary Bike  L2 x 10 min   PT present to discuss status     Knee/Hip Exercises: Standing   Forward Lunges  Left;10 reps;2 sets   right foot on slider going backward   Wall Squat  1 set;10 reps;5 seconds   ball on wall   SLS  right foot on slider- hip abduction right standing on left 10x 2,     Other Standing Knee Exercises  stand on right foot and move left  semicircle on slider      Knee/Hip Exercises: Supine   Bridges  Strengthening;1 set;Both   5 sec   Bridges with Clamshell  Strengthening;Both;1 set;10 reps    Other Supine Knee/Hip Exercises  right leg circles 10 x each way      Knee/Hip Exercises: Sidelying   Hip ADduction  Strengthening;Right;1 set;10 reps             PT Education - 11/27/18 1141    Education Details  gave patient a letter for work to show he has been attending therapy    Person(s) Educated  Patient    Methods  Explanation    Comprehension  Verbalized understanding       PT Short Term Goals - 11/22/18 1515      PT SHORT TERM GOAL #1   Title  Patient independent with initial HEP    Time  3    Period  Weeks    Status  Achieved      PT SHORT TERM GOAL #2   Title  ability to walk without a limp due to pelvis in correct  alignment and pain decreased >/= 25%    Time  3    Period  Weeks    Status  Achieved        PT Long Term Goals - 11/27/18 1143      PT LONG TERM GOAL #1   Title  independent with HEP and understand how to progress himself at the gym    Baseline  still learning    Time  6    Period  Weeks    Status  On-going      PT LONG TERM GOAL #2   Title  able to lift items with correct body mechanics with pain due to reduction of trigger points in the hip adductors and quadricep    Time  3    Period  Weeks    Status  On-going      PT LONG TERM GOAL #3   Title  full lumbar ROM so patient is able to lay on his stomach and side without pain    Time  6    Period  Weeks    Status  On-going      PT LONG TERM GOAL #4   Title  bilateral hip strength is >/=4/5 so patient is able to walk without pain or limp    Time  6    Period  Weeks    Status  On-going      PT LONG TERM GOAL #5   Title  sit without difficulty or pain due to improved fascial restrictions in the right thigh    Time  6    Period  Weeks    Status  On-going            Plan - 11/27/18 1142    Clinical Impression  Statement  Patient is not having pain. Patient is working on strength of right hip and pelvis for stabilization. Patient was able to perform exercises without pain. Patient will benefit from skilled therapy to improve strength and function.     Rehab Potential  Excellent    Clinical Impairments Affecting Rehab Potential  none    PT Frequency  2x / week    PT Duration  6 weeks    PT Treatment/Interventions  Iontophoresis 4mg /ml Dexamethasone;Moist Heat;Ultrasound;Cryotherapy;Gait training;Therapeutic activities;Therapeutic exercise;Neuromuscular re-education;Manual techniques;Patient/family education;Passive range of motion;Dry needling    PT Next Visit Plan  pelvic stability exercises, right hip strength; update HEP for strengthening; ask about laying on stomach and sitting    PT Home Exercise Plan  Access Code: [email or web address]    Consulted and Agree with Plan of Care  Patient       Patient will benefit from skilled therapeutic intervention in order to improve the following deficits and impairments:  Increased fascial restricitons, Pain, Decreased mobility, Increased muscle spasms, Decreased activity tolerance, Decreased endurance, Decreased range of motion, Decreased strength, Difficulty walking  Visit Diagnosis: Pain in right hip  Muscle weakness (generalized)  Cramp and spasm     Problem List There are no active problems to display for this patient.   Eulis FosterCheryl Gray, PT 11/27/18 11:44 AM   [facility] [address], [address] [place], [place], [address] Phone: [phone]   Fax:  [phone]  Name: Douglas KrebsCarlton Frank MRN: [id number] Date of Birth: [birth year]

## 2018-11-29 ENCOUNTER — Ambulatory Visit: Payer: 59 | Admitting: Physical Therapy

## 2018-12-04 ENCOUNTER — Encounter: Payer: Self-pay | Admitting: Physical Therapy

## 2018-12-04 ENCOUNTER — Ambulatory Visit: Payer: 59 | Admitting: Physical Therapy

## 2018-12-04 DIAGNOSIS — R252 Cramp and spasm: Secondary | ICD-10-CM

## 2018-12-04 DIAGNOSIS — M25551 Pain in right hip: Secondary | ICD-10-CM | POA: Diagnosis not present

## 2018-12-04 DIAGNOSIS — M6281 Muscle weakness (generalized): Secondary | ICD-10-CM

## 2018-12-04 NOTE — Therapy (Signed)
[facility] [address] [place], [place], [address] Phone: [phone]   Fax:  [phone]  Physical Therapy Treatment  Patient Details  Name: Douglas Frank MRN: [id number] Date of Birth: [birth year] Referring Provider (PT): Dr. Irineo Axon   Encounter Date: 12/04/2018  PT End of Session - 12/04/18 0807    Visit Number  7    Date for PT Re-Evaluation  12/21/18    Authorization Type  Aetna    PT Start Time  0800    PT Stop Time  0840    PT Time Calculation (min)  40 min    Activity Tolerance  Patient tolerated treatment well;No increased pain    Behavior During Therapy  [facility] for tasks assessed/performed       History reviewed. No pertinent past medical history.  Past Surgical History:  Procedure Laterality Date  . COLONOSCOPY  2011    There were no vitals filed for this visit.  Subjective Assessment - 12/04/18 0805    Subjective  I had increased pain in my right groin yesterday due to increased exercise. I felt [place]. I feel most of the pain in the right hamstring. The inner thighs feels better and right hip,     Patient Stated Goals  reduce pain, go back to gym, lift things,     Currently in Pain?  No/denies    Multiple Pain Sites  No                       [facility] Adult PT Treatment/Exercise - 12/04/18 0001      Knee/Hip Exercises: Stretches   Other Knee/Hip Stretches  manually stretched right hip for posterior capsuel, hip flexion, hip abductors, sitting hip adductor       Knee/Hip Exercises: Aerobic   Nustep  6 min level 3 just legs, seat# 10      Knee/Hip Exercises: Standing   Forward Lunges  Right;Left;10 reps;1 set   Tactile cues without lettting knee go past foot   Other Standing Knee Exercises  dead lift with 6# in each hand      Knee/Hip Exercises: Supine   Single Leg Bridge  Strengthening;Right;1 set;10 reps      Manual Therapy   Manual Therapy  Soft tissue  mobilization    Manual therapy comments  used Addaday to right hamstring and hip adductors       Trigger Point Dry Needling - 12/04/18 0823    Consent Given?  Yes    Muscles Treated Lower Body  Hamstring;Adductor longus/brevius/maximus   right   Adductor Response  Twitch response elicited;Palpable increased muscle length    Hamstring Response  Twitch response elicited;Palpable increased muscle length           PT Education - 12/04/18 0840    Education Details  Access Code: [email or web address]     Person(s) Educated  Patient    Methods  Explanation;Demonstration;Verbal cues;Handout    Comprehension  Returned demonstration;Verbalized understanding       PT Short Term Goals - 11/22/18 1515      PT SHORT TERM GOAL #1   Title  Patient independent with initial HEP    Time  3    Period  Weeks    Status  Achieved      PT SHORT TERM GOAL #2   Title  ability to walk without a limp due to pelvis in correct alignment and pain decreased >/= 25%    Time  3    Period  Weeks    Status  Achieved        PT Long Term Goals - 12/04/18 2482      PT LONG TERM GOAL #5   Title  sit without difficulty or pain due to improved fascial restrictions in the right thigh    Baseline  75% less pain    Time  6    Period  Weeks    Status  On-going            Plan - 12/04/18 0829    Clinical Impression Statement  Patient was able to lay on his stomach in therapy while he was dry needled without increased pain. Patient reports pain decreased by 75% in sitting. Patient pelvis in correct alignment. Patient has difficulty with one legged bridge on the right. Patient has increased difficulty with lunges and right leg behind. Patient will benefit from skilled therapy to improve strength and function.     Rehab Potential  Excellent    Clinical Impairments Affecting Rehab Potential  none    PT Frequency  2x / week    PT Treatment/Interventions  Iontophoresis 4mg /ml Dexamethasone;Moist  Heat;Ultrasound;Cryotherapy;Gait training;Therapeutic activities;Therapeutic exercise;Neuromuscular re-education;Manual techniques;Patient/family education;Passive range of motion;Dry needling    PT Next Visit Plan  Check lumbar ROM; pelvic stability exercises, right hip strength; update HEP for strengthening    PT Home Exercise Plan  Access Code: [email or web address]    Consulted and Agree with Plan of Care  Patient       Patient will benefit from skilled therapeutic intervention in order to improve the following deficits and impairments:  Increased fascial restricitons, Pain, Decreased mobility, Increased muscle spasms, Decreased activity tolerance, Decreased endurance, Decreased range of motion, Decreased strength, Difficulty walking  Visit Diagnosis: Pain in right hip  Muscle weakness (generalized)  Cramp and spasm     Problem List There are no active problems to display for this patient.   Eulis Foster, PT 12/04/18 8:43 AM   [facility] [address], [address] [place], [place], [address] Phone: [phone]   Fax:  [phone]  Name: Sugar Lulgjuraj MRN: [id number] Date of Birth: 06-May-1991

## 2018-12-04 NOTE — Patient Instructions (Signed)
Access Code: [email or web address]  URL: [email or web address]  Date: 12/04/2018  Prepared by: Eulis Foster   Exercises  Hip Adductors and Hamstring Stretch with Strap - 2 reps - 1 sets - 30 sec hold - 1x daily - 7x weekly  Hooklying Hamstring Stretch with Strap - 2 reps - 1 sets - 30 sec hold - 2x daily - 7x weekly  Sidelying Quadriceps Stretch with Strap - 2 reps - 1 sets - 30 sec hold - 3x daily - 7x weekly  Supine Piriformis Stretch Pulling Heel to Hip - 2 reps - 1 sets - 30 sec hold - 1x daily - 7x weekly  Seated Piriformis Stretch with Trunk Bend - 2 reps - 1 sets - 30 sec hold - 1x daily - 7x weekly  Clamshell - 20 reps - 1 sets - 1 sec hold - 1x daily - 7x weekly  V Sit Hip Adductor Hamstring Stretch - 1 reps - 1 sets - hold - 1x daily - 7x weekly  Figure 4 Bridge - 10 reps - 1 sets - 1x daily - 7x weekly  Standing Deadlift with Barbell - Knees Straight - 10 reps - 1 sets - 1x daily - 7x weekly  Static Lunge - 10 reps - 1 sets - 1x daily - 7x weekly  Patient Education  Trigger [facility] Dry Needling [facility] [address] [place], [place] [address] Phone # [phone] Fax [phone]

## 2018-12-06 ENCOUNTER — Ambulatory Visit: Payer: 59 | Admitting: Physical Therapy

## 2018-12-06 ENCOUNTER — Encounter: Payer: Self-pay | Admitting: Physical Therapy

## 2018-12-06 DIAGNOSIS — M6281 Muscle weakness (generalized): Secondary | ICD-10-CM

## 2018-12-06 DIAGNOSIS — R252 Cramp and spasm: Secondary | ICD-10-CM

## 2018-12-06 DIAGNOSIS — M25551 Pain in right hip: Secondary | ICD-10-CM | POA: Diagnosis not present

## 2018-12-06 NOTE — Therapy (Signed)
[facility] [address], [address] [place], [place], [address] Phone: [phone]   Fax:  [phone]  Physical Therapy Treatment  Patient Details  Name: Douglas Frank MRN: [id number] Date of Birth: 12/05/1990 Referring Provider (PT): Dr. Irineo Axon   Encounter Date: 12/06/2018  PT End of Session - 12/06/18 1406    Visit Number  8    Date for PT Re-Evaluation  12/21/18    Authorization Type  Aetna    PT Start Time  1400    PT Stop Time  1442    PT Time Calculation (min)  42 min    Activity Tolerance  Patient tolerated treatment well;No increased pain    Behavior During Therapy  [facility] for tasks assessed/performed       History reviewed. No pertinent past medical history.  Past Surgical History:  Procedure Laterality Date  . COLONOSCOPY  2011    There were no vitals filed for this visit.  Subjective Assessment - 12/06/18 1407    Subjective  Rt hip hurt more from again from last session. New pain in my outter Rt hip and high up into my groin.     Currently in Pain?  Yes    Pain Score  3     Pain Location  --   Rt hip and groin   Pain Orientation  Right;Lateral;Medial    Pain Descriptors / Indicators  Tightness;Dull;Aching    Aggravating Factors   maybe these latest exercises    Pain Relieving Factors  stretching, relaxing on my back    Multiple Pain Sites  No                       [facility] Adult PT Treatment/Exercise - 12/06/18 0001      Lumbar Exercises: Quadruped   Straight Leg Raise  --   2x bil holding 5 sec to get pelvis level; shakey     Knee/Hip Exercises: Stretches   Other Knee/Hip Stretches  Yoga Hand to Big Toe stretch/pose 2x through 30 sec each position    Other Knee/Hip Stretches  Seated lumbar flexion strtech with yoga strap bil 30 sec each      Knee/Hip Exercises: Aerobic   Recumbent Bike  L2 x 10 min, RPMS > 50       Knee/Hip Exercises: Prone   Straight Leg Raises   AROM;Strengthening;Right;2 sets;10 reps   VC for technique, visably shakey            PT Education - 12/06/18 1425    Education Details  HEP addition for pelvic stabilization    Person(s) Educated  Patient    Methods  Explanation;Demonstration;Verbal cues    Comprehension  Verbalized understanding;Returned demonstration       PT Short Term Goals - 11/22/18 1515      PT SHORT TERM GOAL #1   Title  Patient independent with initial HEP    Time  3    Period  Weeks    Status  Achieved      PT SHORT TERM GOAL #2   Title  ability to walk without a limp due to pelvis in correct alignment and pain decreased >/= 25%    Time  3    Period  Weeks    Status  Achieved        PT Long Term Goals - 12/04/18 6962      PT LONG TERM GOAL #5   Title  sit without difficulty  or pain due to improved fascial restrictions in the right thigh    Baseline  75% less pain    Time  6    Period  Weeks    Status  On-going            Plan - 12/06/18 1407    Clinical Impression Statement  Pt reports his RT hip still is bothersome which he thinks are from the last new exercises given to him. We discussed holding on single leg bridge and lunges for 1-2 days and assess if that is helpful. Advised pt to add them back in slowly, possibly reducing the number of reps from 10 to 5 as he gains more strength. Today we added supine level 1 pelvic stability to his HEP, prone hip extension for hamstring strength and quadruped hip extension so he can work his core and hip extensor strength simultaneously.     Rehab Potential  Excellent    Clinical Impairments Affecting Rehab Potential  none    PT Frequency  2x / week    PT Duration  6 weeks    PT Treatment/Interventions  Iontophoresis 4mg /ml Dexamethasone;Moist Heat;Ultrasound;Cryotherapy;Gait training;Therapeutic activities;Therapeutic exercise;Neuromuscular re-education;Manual techniques;Patient/family education;Passive range of motion;Dry needling    PT  Next Visit Plan  Dry needling next session, review exercises given today for pelvic stability, including prone hip extension and quadruped hip extension.    PT Home Exercise Plan  Access Code: [email or web address]    Consulted and Agree with Plan of Care  Patient       Patient will benefit from skilled therapeutic intervention in order to improve the following deficits and impairments:  Increased fascial restricitons, Pain, Decreased mobility, Increased muscle spasms, Decreased activity tolerance, Decreased endurance, Decreased range of motion, Decreased strength, Difficulty walking  Visit Diagnosis: Pain in right hip  Muscle weakness (generalized)  Cramp and spasm     Problem List There are no active problems to display for this patient.   Ghazal Pevey, PTA 12/06/2018, 2:48 PM  [facility] [address], [address] [place], [place], [address] Phone: [phone]   Fax:  [phone]  Name: Tiler Vaziri MRN: [id number] Date of Birth: Nov 06, 1990

## 2018-12-06 NOTE — Patient Instructions (Signed)

## 2018-12-11 ENCOUNTER — Encounter: Payer: Self-pay | Admitting: Physical Therapy

## 2018-12-11 ENCOUNTER — Ambulatory Visit: Payer: 59 | Admitting: Physical Therapy

## 2018-12-11 DIAGNOSIS — M25551 Pain in right hip: Secondary | ICD-10-CM | POA: Diagnosis not present

## 2018-12-11 DIAGNOSIS — M6281 Muscle weakness (generalized): Secondary | ICD-10-CM

## 2018-12-11 DIAGNOSIS — R252 Cramp and spasm: Secondary | ICD-10-CM

## 2018-12-11 NOTE — Therapy (Signed)
[facility] [address], [address] [place], [place], [address] Phone: [phone]   Fax:  [phone]  Physical Therapy Treatment  Patient Details  Name: Douglas Frank MRN: [id number] Date of Birth: 03/16/1991 Referring Provider (PT): Dr. Irineo Axon   Encounter Date: 12/11/2018  PT End of Session - 12/11/18 0816    Visit Number  9    Date for PT Re-Evaluation  12/21/18    Authorization Type  Aetna    PT Start Time  0800    PT Stop Time  0840    PT Time Calculation (min)  40 min    Activity Tolerance  Patient tolerated treatment well;No increased pain    Behavior During Therapy  [facility] for tasks assessed/performed       History reviewed. No pertinent past medical history.  Past Surgical History:  Procedure Laterality Date  . COLONOSCOPY  2011    There were no vitals filed for this visit.  Subjective Assessment - 12/11/18 0804    Subjective  I am building up my strength in the right leg. I can lay on my left side now.     Patient Stated Goals  reduce pain, go back to gym, lift things,     Currently in Pain?  Yes    Pain Score  2     Pain Location  Leg    Pain Orientation  Right    Pain Descriptors / Indicators  Tightness;Dull;Aching    Pain Type  Acute pain    Pain Onset  More than a month ago    Pain Frequency  Intermittent    Aggravating Factors   sitting with leaning back, sitting long period of time    Pain Relieving Factors  stretching, relaxing my back    Multiple Pain Sites  No                       [facility] Adult PT Treatment/Exercise - 12/11/18 0001      Lumbar Exercises: Quadruped   Opposite Arm/Leg Raise  Right arm/Left leg;Left arm/Right leg;10 reps;3 seconds      Knee/Hip Exercises: Stretches   Active Hamstring Stretch  Right;1 rep;30 seconds    Piriformis Stretch  Right;Left;1 rep;30 seconds      Knee/Hip Exercises: Aerobic   Recumbent Bike  L2 x 10 min, RPMS > 50        Knee/Hip Exercises: Machines for Strengthening   Cybex Leg Press  seat#6 45# 15x, 50# 15x right leg      Knee/Hip Exercises: Standing   Functional Squat  20 reps   holding 8# with VC to go all the way down   SLS  right foot on slider- hip abduction right standing on left 10x 2,     SLS with Vectors  stand on right leg and bring left leg behind the right 15x    Other Standing Knee Exercises  dead lift with 8# in each hand 20x      Manual Therapy   Manual Therapy  Soft tissue mobilization    Soft tissue mobilization  useing the addaday to the righ thip adductor, hamstring, gluteal, and ITB       Trigger Point Dry Needling - 12/11/18 0840    Consent Given?  Yes    Muscles Treated Lower Body  Hamstring;Adductor longus/brevius/maximus    Adductor Response  Twitch response elicited;Palpable increased muscle length    Hamstring Response  Twitch response elicited;Palpable increased muscle  length           PT Education - 12/11/18 0842    Education Details  Access Code: [email or web address]     Person(s) Educated  Patient    Methods  Explanation;Demonstration;Handout;Verbal cues    Comprehension  Returned demonstration;Verbalized understanding       PT Short Term Goals - 11/22/18 1515      PT SHORT TERM GOAL #1   Title  Patient independent with initial HEP    Time  3    Period  Weeks    Status  Achieved      PT SHORT TERM GOAL #2   Title  ability to walk without a limp due to pelvis in correct alignment and pain decreased >/= 25%    Time  3    Period  Weeks    Status  Achieved        PT Long Term Goals - 12/11/18 6803      PT LONG TERM GOAL #1   Title  independent with HEP and understand how to progress himself at the gym    Baseline  still learning    Time  6    Period  Weeks    Status  On-going      PT LONG TERM GOAL #2   Title  able to lift items with correct body mechanics with pain due to reduction of trigger points in the hip adductors and quadricep    Time  3     Period  Weeks    Status  On-going      PT LONG TERM GOAL #5   Title  sit without difficulty or pain due to improved fascial restrictions in the right thigh    Baseline  75% less pain    Time  6    Period  Weeks    Status  On-going            Plan - 12/11/18 2122    Clinical Impression Statement  Patient is able to lay on left side without pain. Patient has increased pain with long term sitting and sitting while leaning back. Patient has difficulty with deep squat due to tightness in the groin. Patient has not started lifting items at home yet. Patient is feeling stronger on the right leg. Patient will benefit from skilled therapy to improve strength and stability.     Rehab Potential  Excellent    Clinical Impairments Affecting Rehab Potential  none    PT Frequency  2x / week    PT Duration  6 weeks    PT Treatment/Interventions  Iontophoresis 4mg /ml Dexamethasone;Moist Heat;Ultrasound;Cryotherapy;Gait training;Therapeutic activities;Therapeutic exercise;Neuromuscular re-education;Manual techniques;Patient/family education;Passive range of motion;Dry needling    PT Next Visit Plan  work on squatting, right hip and SI stability, one legged stance activities    PT Home Exercise Plan  Access Code: [email or web address]    Consulted and Agree with Plan of Care  Patient       Patient will benefit from skilled therapeutic intervention in order to improve the following deficits and impairments:  Increased fascial restricitons, Pain, Decreased mobility, Increased muscle spasms, Decreased activity tolerance, Decreased endurance, Decreased range of motion, Decreased strength, Difficulty walking  Visit Diagnosis: Pain in right hip  Muscle weakness (generalized)  Cramp and spasm     Problem List There are no active problems to display for this patient.   Eulis Foster, PT 12/11/18 8:44 AM   [facility] [address] [place], [place], [address] Phone: [phone]   Fax:  [phone]  Name: Haadi Merenda MRN: [id number] Date of Birth: [birth year]

## 2018-12-11 NOTE — Patient Instructions (Signed)
Access Code: [email or web address]  URL: [email or web address]  Date: 12/11/2018  Prepared by: Eulis Foster   Exercises  Hip Adductors and Hamstring Stretch with Strap - 2 reps - 1 sets - 30 sec hold - 1x daily - 7x weekly  Hooklying Hamstring Stretch with Strap - 2 reps - 1 sets - 30 sec hold - 2x daily - 7x weekly  Sidelying Quadriceps Stretch with Strap - 2 reps - 1 sets - 30 sec hold - 3x daily - 7x weekly  Supine Piriformis Stretch Pulling Heel to Hip - 2 reps - 1 sets - 30 sec hold - 1x daily - 7x weekly  Seated Piriformis Stretch with Trunk Bend - 2 reps - 1 sets - 30 sec hold - 1x daily - 7x weekly  Clamshell - 20 reps - 1 sets - 1 sec hold - 1x daily - 7x weekly  V Sit Hip Adductor Hamstring Stretch - 1 reps - 1 sets - hold - 1x daily - 7x weekly  Figure 4 Bridge - 10 reps - 1 sets - 1x daily - 7x weekly  Standing Deadlift with Barbell - Knees Straight - 10 reps - 1 sets - 1x daily - 7x weekly  Static Lunge - 10 reps - 1 sets - 1x daily - 7x weekly  Prone Hip Extension - 10 reps - 2 sets - 3 hold - 1x daily - 7x weekly  Kettlebell Deadlift - 10 reps - 3 sets - 1x daily - 7x weekly  Deep Squat with Pelvic Floor Relaxation - 2 reps - 1 sets - 30 sec hold - 1x daily - 7x weekly  Sumo Squat with Dumbbell - 10 reps - 1 sets - 1x daily - 7x weekly  Quadruped Pelvic Floor Contraction with Opposite Arm and Leg Lift - 10 reps - 1 sets - 1x daily - 7x weekly  Patient Education  [facility] [address], [address] [place], [place] [address] Phone # [phone] Fax [phone]

## 2018-12-13 ENCOUNTER — Ambulatory Visit: Payer: 59 | Admitting: Physical Therapy

## 2018-12-13 ENCOUNTER — Encounter: Payer: Self-pay | Admitting: Physical Therapy

## 2018-12-13 DIAGNOSIS — M25551 Pain in right hip: Secondary | ICD-10-CM

## 2018-12-13 DIAGNOSIS — M6281 Muscle weakness (generalized): Secondary | ICD-10-CM

## 2018-12-13 DIAGNOSIS — R252 Cramp and spasm: Secondary | ICD-10-CM

## 2018-12-13 NOTE — Therapy (Signed)
[facility] [address] [place], [place], [address] Phone: [phone]   Fax:  [phone]  Physical Therapy Treatment  Patient Details  Name: Douglas Frank MRN: [id number] Date of Birth: 04-29-1991 Referring Provider (PT): Dr. Irineo Axon   Encounter Date: 12/13/2018  PT End of Session - 12/13/18 1404    Visit Number  144    Date for PT Re-Evaluation  12/21/18    Authorization Type  [employer]    PT Start Time  1402    PT Stop Time  1442    PT Time Calculation (min)  40 min    Activity Tolerance  Patient tolerated treatment well;No increased pain    Behavior During Therapy  [facility] for tasks assessed/performed       History reviewed. No pertinent past medical history.  Past Surgical History:  Procedure Laterality Date  . COLONOSCOPY  2011    There were no vitals filed for this visit.  Subjective Assessment - 12/13/18 1405    Subjective  Can't complain today. I am just tired from life today.     Currently in Pain?  No/denies    Multiple Pain Sites  No                       [facility] Adult PT Treatment/Exercise - 12/13/18 0001      Knee/Hip Exercises: Stretches   Active Hamstring Stretch  Right;1 rep;30 seconds    Other Knee/Hip Stretches  adductor release with softr foam roll x 3 min RTLE      Knee/Hip Exercises: Aerobic   Recumbent Bike  L2 x 10 min, RPMS > 50       Knee/Hip Exercises: Machines for Strengthening   Cybex Leg Press  Seat 6 RTLE 50# 15x 55# x 15       Knee/Hip Exercises: Standing   Functional Squat  --   Deep squats with 8# 2x10   Wall Squat  --   with red ball 2x10, VC to set LE up   SLS  right foot on slider- hip abduction right standing on left 10x 2,    first set knee bend ( standing)2nd set knee straight   SLS with Vectors  SLS RT on black pad with ball toss 2x10    Other Standing Knee Exercises  dead lift with 8# in each hand 20x               PT  Short Term Goals - 11/22/18 1515      PT SHORT TERM GOAL #1   Title  Patient independent with initial HEP    Time  3    Period  Weeks    Status  Achieved      PT SHORT TERM GOAL #2   Title  ability to walk without a limp due to pelvis in correct alignment and pain decreased >/= 25%    Time  3    Period  Weeks    Status  Achieved        PT Long Term Goals - 12/11/18 4503      PT LONG TERM GOAL #1   Title  independent with HEP and understand how to progress himself at the gym    Baseline  still learning    Time  6    Period  Weeks    Status  On-going      PT LONG TERM GOAL #2   Title  able to  lift items with correct body mechanics with pain due to reduction of trigger points in the hip adductors and quadricep    Time  3    Period  Weeks    Status  On-going      PT LONG TERM GOAL #5   Title  sit without difficulty or pain due to improved fascial restrictions in the right thigh    Baseline  75% less pain    Time  6    Period  Weeks    Status  On-going            Plan - 12/13/18 1405    Clinical Impression Statement  Pt arrives to PT today with no pain just tired from his work day. He performed various squatting and single leg stance exercises all without pain and very minimal cues regarding technique.     Rehab Potential  Excellent    Clinical Impairments Affecting Rehab Potential  none    PT Frequency  2x / week    PT Duration  6 weeks    PT Treatment/Interventions  Iontophoresis 4mg /ml Dexamethasone;Moist Heat;Ultrasound;Cryotherapy;Gait training;Therapeutic activities;Therapeutic exercise;Neuromuscular re-education;Manual techniques;Patient/family education;Passive range of motion;Dry needling    PT Next Visit Plan  ERO visit next    PT Home Exercise Plan  Access Code: [email or web address]    Consulted and Agree with Plan of Care  Patient       Patient will benefit from skilled therapeutic intervention in order to improve the following deficits and impairments:   Increased fascial restricitons, Pain, Decreased mobility, Increased muscle spasms, Decreased activity tolerance, Decreased endurance, Decreased range of motion, Decreased strength, Difficulty walking  Visit Diagnosis: Pain in right hip  Muscle weakness (generalized)  Cramp and spasm     Problem List There are no active problems to display for this patient.   ,, PTA 12/13/2018, 2:41 PM  [facility] [address] [place], [place], [address] Phone: [phone]   Fax:  [phone]  Name: Fidel Tota MRN: [id number] Date of Birth: [birth year]

## 2018-12-18 ENCOUNTER — Encounter: Payer: Self-pay | Admitting: Physical Therapy

## 2018-12-18 ENCOUNTER — Ambulatory Visit: Payer: 59 | Attending: Urology | Admitting: Physical Therapy

## 2018-12-18 DIAGNOSIS — M25551 Pain in right hip: Secondary | ICD-10-CM | POA: Insufficient documentation

## 2018-12-18 DIAGNOSIS — M6281 Muscle weakness (generalized): Secondary | ICD-10-CM | POA: Diagnosis present

## 2018-12-18 DIAGNOSIS — R252 Cramp and spasm: Secondary | ICD-10-CM

## 2018-12-18 NOTE — Therapy (Signed)
[facility] [address], [place], [place], [address] Phone: [phone]   Fax:  [phone]  Physical Therapy Treatment  Patient Details  Name: Douglas Frank MRN: [id number] Date of Birth: 1990/12/17 Referring Provider (PT): Dr. John Giovanni   Encounter Date: 12/18/2018  PT End of Session - 12/18/18 0937    Visit Number  11    Date for PT Re-Evaluation  12/21/18    Authorization Type  Aetna    PT Start Time  0930    PT Stop Time  1000    PT Time Calculation (min)  30 min    Activity Tolerance  Patient tolerated treatment well;No increased pain    Behavior During Therapy  [facility] for tasks assessed/performed       History reviewed. No pertinent past medical history.  Past Surgical History:  Procedure Laterality Date  . COLONOSCOPY  2011    There were no vitals filed for this visit.  Subjective Assessment - 12/18/18 0936    Subjective  I feel asleep on my stomach without pain. My wife is able to lean on me without difficulty. I had an active weekend and no difficulty.     Patient Stated Goals  reduce pain, go back to gym, lift things,     Currently in Pain?  No/denies    Multiple Pain Sites  No         [facility] PT Assessment - 12/18/18 0001      Assessment   Medical Diagnosis  [id number] Musculoskeletal pain of right thigh    Referring Provider (PT)  Dr. John Giovanni    Onset Date/Surgical Date  06/18/18    Prior Therapy  none      Precautions   Precautions  None      Restrictions   Weight Bearing Restrictions  No      Prior Function   Level of Independence  Independent    Vocation  Full time employment    Vocation Requirements  sitting right now, past job was lifting    Leisure  basketball, gym      Cognition   Overall Cognitive Status  Within Functional Limits for tasks assessed      Observation/Other Assessments   Focus on Therapeutic Outcomes (FOTO)   no limitations      AROM   Lumbar  Flexion  full no deviation    Lumbar Extension  full    Lumbar - Right Side Bend  full    Lumbar - Left Side Bend  full      PROM   Right Hip External Rotation   70    Right Hip Internal Rotation   30      Strength   Right Hip External Rotation   5/5    Right Hip Internal Rotation  5/5    Right Hip ABduction  5/5    Right Hip ADduction  5/5    Left Hip External Rotation  5/5    Left Hip Internal Rotation  5/5    Left Hip ABduction  5/5    Left Hip ADduction  5/5      Palpation   Spinal mobility  full lumbar mobility    SI assessment   pelvis in correct alignment                   [facility] Adult PT Treatment/Exercise - 12/18/18 0001      Lumbar Exercises: Quadruped   Opposite Arm/Leg Raise  Right arm/Left leg;Left arm/Right leg;10 reps;3 seconds      Knee/Hip Exercises: Stretches   Active Hamstring Stretch  Right;1 rep;30 seconds    Other Knee/Hip Stretches  single knee to chest hold 30 sec 1 time each      Knee/Hip Exercises: Standing   Forward Lunges  Right;Left;10 reps;1 set    Functional Squat  --   Deep squats with 8# 2x10   Other Standing Knee Exercises  dead lift with 8# in each hand 20x      Knee/Hip Exercises: Supine   Single Leg Bridge  Strengthening;Right;1 set;5 reps      Knee/Hip Exercises: Prone   Hip Extension  Strengthening;Right;1 set;10 reps;Left             PT Education - 12/18/18 1002    Education Details  Access Code: [email or web address]    Person(s) Educated  Patient    Methods  Explanation;Demonstration;Verbal cues;Handout    Comprehension  Returned demonstration;Verbalized understanding       PT Short Term Goals - 12/18/18 0948      PT SHORT TERM GOAL #1   Title  Patient independent with initial HEP    Time  3    Period  Weeks    Status  Achieved      PT SHORT TERM GOAL #2   Title  ability to walk without a limp due to pelvis in correct alignment and pain decreased >/= 25%    Time  3    Period  Weeks    Status  Achieved         PT Long Term Goals - 12/18/18 6389      PT LONG TERM GOAL #1   Title  independent with HEP and understand how to progress himself at the gym    Time  6    Period  Weeks    Status  Achieved      PT LONG TERM GOAL #2   Title  able to lift items with correct body mechanics with pain due to reduction of trigger points in the hip adductors and quadricep    Time  3    Period  Weeks    Status  Achieved      PT LONG TERM GOAL #3   Title  full lumbar ROM so patient is able to lay on his stomach and side without pain    Time  6    Period  Weeks    Status  Achieved      PT LONG TERM GOAL #4   Title  bilateral hip strength is >/=4/5 so patient is able to walk without pain or limp    Time  6    Period  Weeks    Status  Achieved      PT LONG TERM GOAL #5   Title  sit without difficulty or pain due to improved fascial restrictions in the right thigh    Time  6    Period  Weeks    Status  Achieved            Plan - 12/18/18 0949    Clinical Impression Statement  Patient has met all of his goals. Patient has tightness sometimes. Patient has full lumbar and right hip ROM. Patient pelvis in correct alignment. Patient strength of bilateral hips is 5/5. Patient is able to sit without difficulty. Patient is able to lay on his stomach without difficulty. Patient is independent with his HEP. Patient is ready for  discharge.     Rehab Potential  Excellent    Clinical Impairments Affecting Rehab Potential  none    PT Treatment/Interventions  Iontophoresis 29m/ml Dexamethasone;Moist Heat;Ultrasound;Cryotherapy;Gait training;Therapeutic activities;Therapeutic exercise;Neuromuscular re-education;Manual techniques;Patient/family education;Passive range of motion;Dry needling    PT Next Visit Plan  Discharge to HEP this visit    PT Home Exercise Plan  Access Code: [email or web address]    Consulted and Agree with Plan of Care  Patient       Patient will benefit from skilled therapeutic intervention in  order to improve the following deficits and impairments:  Increased fascial restricitons, Pain, Decreased mobility, Increased muscle spasms, Decreased activity tolerance, Decreased endurance, Decreased range of motion, Decreased strength, Difficulty walking  Visit Diagnosis: Pain in right hip  Muscle weakness (generalized)  Cramp and spasm     Problem List There are no active problems to display for this patient.   CEarlie Counts PT 12/18/18 10:08 AM   [facility] [address] [place] [address]: [phone]  Fax:  [phone] Name: Douglas CharterMRN: [id number] of Birth: [birth year] PHYSICAL THERAPY DISCHARGE SUMMARY  Visits from Start of Care: 11  Current functional level related to goals / functional outcomes: See above.    Remaining deficits: See above.    Education / Equipment: HEP Plan: Patient agrees to discharge.  Patient goals were met. Patient is being discharged due to meeting the stated rehab goals.  Thank you for the referral. CEarlie Counts PT 12/18/18 10:08 AM  ?????

## 2018-12-18 NOTE — Patient Instructions (Signed)
Access Code: [email or web address]  URL: [email or web address]  Date: 12/18/2018  Prepared by: Eulis Foster   Exercises  Hip Adductors and Hamstring Stretch with Strap - 2 reps - 1 sets - 30 sec hold - 1x daily - 7x weekly  Hooklying Hamstring Stretch with Strap - 2 reps - 1 sets - 30 sec hold - 2x daily - 7x weekly  Sidelying Quadriceps Stretch with Strap - 2 reps - 1 sets - 30 sec hold - 3x daily - 7x weekly  Supine Piriformis Stretch Pulling Heel to Hip - 2 reps - 1 sets - 30 sec hold - 1x daily - 7x weekly  Seated Piriformis Stretch with Trunk Bend - 2 reps - 1 sets - 30 sec hold - 1x daily - 7x weekly  Figure 4 Bridge - 5 reps - 1 sets - 1x daily - 7x weekly  Standing Deadlift with Barbell - Knees Straight - 10 reps - 1 sets - 1x daily - 7x weekly  Static Lunge - 10 reps - 1 sets - 1x daily - 7x weekly  Prone Hip Extension - 10 reps - 2 sets - 3 hold - 1x daily - 7x weekly  Deep Squat with Pelvic Floor Relaxation - 2 reps - 1 sets - 30 sec hold - 1x daily - 7x weekly  Sumo Squat with Dumbbell - 10 reps - 1 sets - 1x daily - 7x weekly  Quadruped Pelvic Floor Contraction with Opposite Arm and Leg Lift - 10 reps - 1 sets - 1x daily - 7x weekly  Patient Education  [facility] [address] [place], [place] [address] Phone # [phone] Fax [phone]

## 2019-12-11 ENCOUNTER — Other Ambulatory Visit: Payer: Self-pay

## 2019-12-11 ENCOUNTER — Ambulatory Visit (INDEPENDENT_AMBULATORY_CARE_PROVIDER_SITE_OTHER): Payer: 59 | Admitting: Adult Health

## 2019-12-11 ENCOUNTER — Encounter: Payer: Self-pay | Admitting: Adult Health

## 2019-12-11 VITALS — BP 122/86 | HR 80 | Temp 97.3°F | Wt 197.0 lb

## 2019-12-11 DIAGNOSIS — M25551 Pain in right hip: Secondary | ICD-10-CM

## 2019-12-11 DIAGNOSIS — M5441 Lumbago with sciatica, right side: Secondary | ICD-10-CM | POA: Diagnosis not present

## 2019-12-11 DIAGNOSIS — Z1329 Encounter for screening for other suspected endocrine disorder: Secondary | ICD-10-CM | POA: Insufficient documentation

## 2019-12-11 DIAGNOSIS — Z6829 Body mass index (BMI) 29.0-29.9, adult: Secondary | ICD-10-CM

## 2019-12-11 DIAGNOSIS — Z0000 Encounter for general adult medical examination without abnormal findings: Secondary | ICD-10-CM

## 2019-12-11 DIAGNOSIS — Z13220 Encounter for screening for lipoid disorders: Secondary | ICD-10-CM | POA: Diagnosis not present

## 2019-12-11 DIAGNOSIS — G8929 Other chronic pain: Secondary | ICD-10-CM

## 2019-12-11 NOTE — Patient Instructions (Addendum)
Hip Pain The hip is the joint between the upper legs and the lower pelvis. The bones, cartilage, tendons, and muscles of your hip joint support your body and allow you to move around. Hip pain can range from a minor ache to severe pain in one or both of your hips. The pain may be felt on the inside of the hip joint near the groin, or on the outside near the buttocks and upper thigh. You may also have swelling or stiffness in your hip area. Follow these instructions at home: Managing pain, stiffness, and swelling      If directed, put ice on the painful area. To do this: ? Put ice in a plastic bag. ? Place a towel between your skin and the bag. ? Leave the ice on for 20 minutes, 2-3 times a day.  If directed, apply heat to the affected area as often as told by your health care provider. Use the heat source that your health care provider recommends, such as a moist heat pack or a heating pad. ? Place a towel between your skin and the heat source. ? Leave the heat on for 20-30 minutes. ? Remove the heat if your skin turns bright red. This is especially important if you are unable to feel pain, heat, or cold. You may have a greater risk of getting burned. Activity  Do exercises as told by your health care provider.  Avoid activities that cause pain. General instructions   Take over-the-counter and prescription medicines only as told by your health care provider.  Keep a journal of your symptoms. Write down: ? How often you have hip pain. ? The location of your pain. ? What the pain feels like. ? What makes the pain worse.  Sleep with a pillow between your legs on your most comfortable side.  Keep all follow-up visits as told by your health care provider. This is important. Contact a health care provider if:  You cannot put weight on your leg.  Your pain or swelling continues or gets worse after one week.  It gets harder to walk.  You have a fever. Get help right away  if:  You fall.  You have a sudden increase in pain and swelling in your hip.  Your hip is red or swollen or very tender to touch. Summary  Hip pain can range from a minor ache to severe pain in one or both of your hips.  The pain may be felt on the inside of the hip joint near the groin, or on the outside near the buttocks and upper thigh.  Avoid activities that cause pain.  Write down how often you have hip pain, the location of the pain, what makes it worse, and what it feels like. This information is not intended to replace advice given to you by your health care provider. Make sure you discuss any questions you have with your health care provider. Document Revised: 02/19/2019 Document Reviewed: 02/19/2019 Elsevier Patient Education  2020 [employer] Chronic Back Pain When back pain lasts longer than 3 months, it is called chronic back pain.The cause of your back pain may not be known. Some common causes include:  Wear and tear (degenerative disease) of the bones, ligaments, or disks in your back.  Inflammation and stiffness in your back (arthritis). People who have chronic back pain often go through certain periods in which the pain is more intense (flare-ups). Many people can learn to manage the pain with home care.  Follow these instructions at home: Pay attention to any changes in your symptoms. Take these actions to help with your pain: Activity   Avoid bending and other activities that make the problem worse.  Maintain a proper position when standing or sitting: ? When standing, keep your upper back and neck straight, with your shoulders pulled back. Avoid slouching. ? When sitting, keep your back straight and relax your shoulders. Do not round your shoulders or pull them backward.  Do not sit or stand in one place for long periods of time.  Take brief periods of rest throughout the day. This will reduce your pain. Resting in a lying or standing position is usually  better than sitting to rest.  When you are resting for longer periods, mix in some mild activity or stretching between periods of rest. This will help to prevent stiffness and pain.  Get regular exercise. Ask your health care provider what activities are safe for you.  Do not lift anything that is heavier than 10 lb (4.5 kg). Always use proper lifting technique, which includes: ? Bending your knees. ? Keeping the load close to your body. ? Avoiding twisting.  Sleep on a firm mattress in a comfortable position. Try lying on your side with your knees slightly bent. If you lie on your back, put a pillow under your knees. Managing pain  If directed, apply ice to the painful area. Your health care provider may recommend applying ice during the first 24-48 hours after a flare-up begins. ? Put ice in a plastic bag. ? Place a towel between your skin and the bag. ? Leave the ice on for 20 minutes, 2-3 times per day.  If directed, apply heat to the affected area as often as told by your health care provider. Use the heat source that your health care provider recommends, such as a moist heat pack or a heating pad. ? Place a towel between your skin and the heat source. ? Leave the heat on for 20-30 minutes. ? Remove the heat if your skin turns bright red. This is especially important if you are unable to feel pain, heat, or cold. You may have a greater risk of getting burned.  Try soaking in a warm tub.  Take over-the-counter and prescription medicines only as told by your health care provider.  Keep all follow-up visits as told by your health care provider. This is important. Contact a health care provider if:  You have pain that is not relieved with rest or medicine. Get help right away if:  You have weakness or numbness in one or both of your legs or feet.  You have trouble controlling your bladder or your bowels.  You have nausea or vomiting.  You have pain in your abdomen.  You  have shortness of breath or you faint. This information is not intended to replace advice given to you by your health care provider. Make sure you discuss any questions you have with your health care provider. Document Revised: 01/25/2019 Document Reviewed: 04/13/2017 Elsevier Patient Education  2020 [employer]   Calorie Counting for [employer] Calories are units of energy. Your body needs a certain amount of calories from food to keep you going throughout the day. When you eat more calories than your body needs, your body stores the extra calories as fat. When you eat fewer calories than your body needs, your body burns fat to get the energy it needs. Calorie counting means keeping track of how  many calories you eat and drink each day. Calorie counting can be helpful if you need to lose weight. If you make sure to eat fewer calories than your body needs, you should lose weight. Ask your health care provider what a healthy weight is for you. For calorie counting to work, you will need to eat the right number of calories in a day in order to lose a healthy amount of weight per week. A dietitian can help you determine how many calories you need in a day and will give you suggestions on how to reach your calorie goal.  A healthy amount of weight to lose per week is usually 1-2 lb (0.5-0.9 kg). This usually means that your daily calorie intake should be reduced by 500-750 calories.  Eating 1,200 - 1,500 calories per day can help most women lose weight.  Eating 1,500 - 1,800 calories per day can help most men lose weight. What is my plan? My goal is to have __________ calories per day. If I have this many calories per day, I should lose around __________ pounds per week. What do I need to know about calorie counting? In order to meet your daily calorie goal, you will need to:  Find out how many calories are in each food you would like to eat. Try to do this before you eat.  Decide how much of  the food you plan to eat.  Write down what you ate and how many calories it had. Doing this is called keeping a food log. To successfully lose weight, it is important to balance calorie counting with a healthy lifestyle that includes regular activity. Aim for 150 minutes of moderate exercise (such as walking) or 75 minutes of vigorous exercise (such as running) each week. Where do I find calorie information?  The number of calories in a food can be found on a Nutrition Facts label. If a food does not have a Nutrition Facts label, try to look up the calories online or ask your dietitian for help. Remember that calories are listed per serving. If you choose to have more than one serving of a food, you will have to multiply the calories per serving by the amount of servings you plan to eat. For example, the label on a package of bread might say that a serving size is 1 slice and that there are 90 calories in a serving. If you eat 1 slice, you will have eaten 90 calories. If you eat 2 slices, you will have eaten 180 calories. How do I keep a food log? Immediately after each meal, record the following information in your food log:  What you ate. Don't forget to include toppings, sauces, and other extras on the food.  How much you ate. This can be measured in cups, ounces, or number of items.  How many calories each food and drink had.  The total number of calories in the meal. Keep your food log near you, such as in a small notebook in your pocket, or use a mobile app or website. Some programs will calculate calories for you and show you how many calories you have left for the day to meet your goal. What are some calorie counting tips?   Use your calories on foods and drinks that will fill you up and not leave you hungry: ? Some examples of foods that fill you up are nuts and nut butters, vegetables, lean proteins, and high-fiber foods like whole grains. High-fiber foods are  foods with more than 5  g fiber per serving. ? Drinks such as sodas, specialty coffee drinks, alcohol, and juices have a lot of calories, yet do not fill you up.  Eat nutritious foods and avoid empty calories. Empty calories are calories you get from foods or beverages that do not have many vitamins or protein, such as candy, sweets, and soda. It is better to have a nutritious high-calorie food (such as an avocado) than a food with few nutrients (such as a bag of chips).  Know how many calories are in the foods you eat most often. This will help you calculate calorie counts faster.  Pay attention to calories in drinks. Low-calorie drinks include water and unsweetened drinks.  Pay attention to nutrition labels for "low fat" or "fat free" foods. These foods sometimes have the same amount of calories or more calories than the full fat versions. They also often have added sugar, starch, or salt, to make up for flavor that was removed with the fat.  Find a way of tracking calories that works for you. Get creative. Try different apps or programs if writing down calories does not work for you. What are some portion control tips?  Know how many calories are in a serving. This will help you know how many servings of a certain food you can have.  Use a measuring cup to measure serving sizes. You could also try weighing out portions on a kitchen scale. With time, you will be able to estimate serving sizes for some foods.  Take some time to put servings of different foods on your favorite plates, bowls, and cups so you know what a serving looks like.  Try not to eat straight from a bag or box. Doing this can lead to overeating. Put the amount you would like to eat in a cup or on a plate to make sure you are eating the right portion.  Use smaller plates, glasses, and bowls to prevent overeating.  Try not to multitask (for example, watch TV or use your computer) while eating. If it is time to eat, sit down at a table and enjoy  your food. This will help you to know when you are full. It will also help you to be aware of what you are eating and how much you are eating. What are tips for following this plan? Reading food labels  Check the calorie count compared to the serving size. The serving size may be smaller than what you are used to eating.  Check the source of the calories. Make sure the food you are eating is high in vitamins and protein and low in saturated and trans fats. Shopping  Read nutrition labels while you shop. This will help you make healthy decisions before you decide to purchase your food.  Make a grocery list and stick to it. Cooking  Try to cook your favorite foods in a healthier way. For example, try baking instead of frying.  Use low-fat dairy products. Meal planning  Use more fruits and vegetables. Half of your plate should be fruits and vegetables.  Include lean proteins like poultry and fish. How do I count calories when eating out?  Ask for smaller portion sizes.  Consider sharing an entree and sides instead of getting your own entree.  If you get your own entree, eat only half. Ask for a box at the beginning of your meal and put the rest of your entree in it so you are not  tempted to eat it.  If calories are listed on the menu, choose the lower calorie options.  Choose dishes that include vegetables, fruits, whole grains, low-fat dairy products, and lean protein.  Choose items that are boiled, broiled, grilled, or steamed. Stay away from items that are buttered, battered, fried, or served with cream sauce. Items labeled "crispy" are usually fried, unless stated otherwise.  Choose water, low-fat milk, unsweetened iced tea, or other drinks without added sugar. If you want an alcoholic beverage, choose a lower calorie option such as a glass of wine or light beer.  Ask for dressings, sauces, and syrups on the side. These are usually high in calories, so you should limit the  amount you eat.  If you want a salad, choose a garden salad and ask for grilled meats. Avoid extra toppings like bacon, cheese, or fried items. Ask for the dressing on the side, or ask for olive oil and vinegar or lemon to use as dressing.  Estimate how many servings of a food you are given. For example, a serving of cooked rice is  cup or about the size of half a baseball. Knowing serving sizes will help you be aware of how much food you are eating at restaurants. The list below tells you how big or small some common portion sizes are based on everyday objects: ? 1 oz--4 stacked dice. ? 3 oz--1 deck of cards. ? 1 tsp--1 die. ? 1 Tbsp-- a ping-pong ball. ? 2 Tbsp--1 ping-pong ball. ?  cup-- baseball. ? 1 cup--1 baseball. Summary  Calorie counting means keeping track of how many calories you eat and drink each day. If you eat fewer calories than your body needs, you should lose weight.  A healthy amount of weight to lose per week is usually 1-2 lb (0.5-0.9 kg). This usually means reducing your daily calorie intake by 500-750 calories.  The number of calories in a food can be found on a Nutrition Facts label. If a food does not have a Nutrition Facts label, try to look up the calories online or ask your dietitian for help.  Use your calories on foods and drinks that will fill you up, and not on foods and drinks that will leave you hungry.  Use smaller plates, glasses, and bowls to prevent overeating. This information is not intended to replace advice given to you by your health care provider. Make sure you discuss any questions you have with your health care provider. Document Revised: 06/23/2018 Document Reviewed: 09/03/2016 Elsevier Patient Education  [place] and Cholesterol Restricted Eating Plan Getting too much fat and cholesterol in your diet may cause health problems. Choosing the right foods helps keep your fat and cholesterol at normal levels. This can keep  you from getting certain diseases. Your doctor may recommend an eating plan that includes:  Total fat: ______% or less of total calories a day.  Saturated fat: ______% or less of total calories a day.  Cholesterol: less than _________mg a day.  Fiber: ______g a day. What are tips for following this plan? Meal planning  At meals, divide your plate into four equal parts: ? Fill one-half of your plate with vegetables and green salads. ? Fill one-fourth of your plate with whole grains. ? Fill one-fourth of your plate with low-fat (lean) protein foods.  Eat fish that is high in omega-3 fats at least two times a week. This includes mackerel, tuna, sardines, and salmon.  Eat foods that  are high in fiber, such as whole grains, beans, apples, broccoli, carrots, peas, and barley. General tips   Work with your doctor to lose weight if you need to.  Avoid: ? Foods with added sugar. ? Fried foods. ? Foods with partially hydrogenated oils.  Limit alcohol intake to no more than 1 drink a day for nonpregnant women and 2 drinks a day for men. One drink equals 12 oz of beer, 5 oz of wine, or 1 oz of hard liquor. Reading food labels  Check food labels for: ? Trans fats. ? Partially hydrogenated oils. ? Saturated fat (g) in each serving. ? Cholesterol (mg) in each serving. ? Fiber (g) in each serving.  Choose foods with healthy fats, such as: ? Monounsaturated fats. ? Polyunsaturated fats. ? Omega-3 fats.  Choose grain products that have whole grains. Look for the word "whole" as the first word in the ingredient list. Cooking  Cook foods using low-fat methods. These include baking, boiling, grilling, and broiling.  Eat more home-cooked foods. Eat at restaurants and buffets less often.  Avoid cooking using saturated fats, such as butter, cream, palm oil, palm kernel oil, and coconut oil. Recommended foods  Fruits  All fresh, canned (in natural juice), or frozen  fruits. Vegetables  Fresh or frozen vegetables (raw, steamed, roasted, or grilled). Green salads. Grains  Whole grains, such as whole wheat or whole grain breads, crackers, cereals, and pasta. Unsweetened oatmeal, bulgur, barley, quinoa, or brown rice. Corn or whole wheat flour tortillas. Meats and other protein foods  Ground beef (85% or leaner), grass-fed beef, or beef trimmed of fat. Skinless chicken or [place]. Ground chicken or [place]. Pork trimmed of fat. All fish and seafood. Egg whites. Dried beans, peas, or lentils. Unsalted nuts or seeds. Unsalted canned beans. Nut butters without added sugar or oil. Dairy  Low-fat or nonfat dairy products, such as skim or 1% milk, 2% or reduced-fat cheeses, low-fat and fat-free ricotta or cottage cheese, or plain low-fat and nonfat yogurt. Fats and oils  Tub margarine without trans fats. Light or reduced-fat mayonnaise and salad dressings. Avocado. Olive, canola, sesame, or safflower oils. The items listed above may not be a complete list of foods and beverages you can eat. Contact a dietitian for more information. Foods to avoid Fruits  Canned fruit in heavy syrup. Fruit in cream or butter sauce. Fried fruit. Vegetables  Vegetables cooked in cheese, cream, or butter sauce. Fried vegetables. Grains  White bread. White pasta. White rice. Cornbread. Bagels, pastries, and croissants. Crackers and snack foods that contain trans fat and hydrogenated oils. Meats and other protein foods  Fatty cuts of meat. Ribs, chicken wings, bacon, sausage, bologna, salami, chitterlings, fatback, hot dogs, bratwurst, and packaged lunch meats. Liver and organ meats. Whole eggs and egg yolks. Chicken and [place] with skin. Fried meat. Dairy  Whole or 2% milk, cream, half-and-half, and cream cheese. Whole milk cheeses. Whole-fat or sweetened yogurt. Full-fat cheeses. Nondairy creamers and whipped toppings. Processed cheese, cheese spreads, and cheese  curds. Beverages  Alcohol. Sugar-sweetened drinks such as sodas, lemonade, and fruit drinks. Fats and oils  Butter, stick margarine, lard, shortening, ghee, or bacon fat. Coconut, palm kernel, and palm oils. Sweets and desserts  Corn syrup, sugars, honey, and molasses. Candy. Jam and jelly. Syrup. Sweetened cereals. Cookies, pies, cakes, donuts, muffins, and ice cream. The items listed above may not be a complete list of foods and beverages you should avoid. Contact a dietitian for more information. Summary  Choosing the right foods helps keep your fat and cholesterol at normal levels. This can keep you from getting certain diseases.  At meals, fill one-half of your plate with vegetables and green salads.  Eat high-fiber foods, like whole grains, beans, apples, carrots, peas, and barley.  Limit added sugar, saturated fats, alcohol, and fried foods. This information is not intended to replace advice given to you by your health care provider. Make sure you discuss any questions you have with your health care provider. Document Revised: 06/07/2018 Document Reviewed: 06/21/2017 Elsevier Patient Education  [place].

## 2019-12-11 NOTE — Progress Notes (Signed)
Patient: Douglas Frank, Male    DOB: 21-Jan-1991, 29 y.o.   MRN: [id number] Visit Date: 12/11/2019  Today's Provider: Marcille Buffy, FNP   Chief Complaint  Patient presents with  . New Patient (Initial Visit)   Subjective:    New Patient Douglas Frank is a 29 y.o. male who presents today for health maintenance and establish care as a new patient, patient reports that he moved to [place] from [place] to finish school he denies previous PCP.He has been here since 2013.  He feels well today, patient states that he would like to address pain in his right groin and hip that has been present since 2018. Patient reports that he had a history of previous injury to groin/hip due to work duties and was previously prescribed Meloxicam and Methocarbamol.Patient reports that he has gone through physical therapy in the past for hip pain and would like to discuss need for referral today.  He reports exercising. He reports he is sleeping well. He reports had an x ray in early 2020  Reports doctor told him no issue on MRI or x ray - he reports his pelvis had some mild misalignment he was told this by his chiropractor and physical therapist. He reports he was able to get relief and felt 90 percent improvement with physical therapy. He stopped physical therapy around April of 2020. Denies any new injury/ trauma. He was originally injured picking up a package he reports.  Has mild tingling in right leg intermittently for over a year. He also reports intermittent groin " tightness" for over a year. Reports he was worked up by Dr. Elenor Quinones and advised ok from urology standpoint.Writter does review this and sees visit on 10/19/2018 that Dr. Elenor Quinones reffered him to Physical therapy for musculoskeletal pain.  He denies any new or worsening symptoms.  He has seen [facility] and [place] for this and had imaging there.  He does have lower back pain.  He also saw urologist in 2020 and was told everything was  normal. He denies any urinary symptoms, denies any hematuria.   He sees choiropractor 2- 3 times per month and does feel better.   Denies any testicular pain or abnormalities.  Denies any rectal pain or pressure.  Patient  denies any fever, chills, rash, chest pain, shortness of breath, nausea, vomiting, or diarrhea.   He denies any loss of bowel or bladder control. He does do self testicular exams and denies any lumps or masses.   He wants to go to physical therapy, he does not wish to return to [facility] however will go to [facility].   Patient  denies any fever, body aches,chills, rash, chest pain, shortness of breath, nausea, vomiting, or diarrhea.  He denies any other concerns for today's visit.  -----------------------------------------------------------------   Review of Systems  Constitutional: Negative.   HENT: Negative.   Eyes: Negative.   Respiratory: Negative.   Cardiovascular: Negative.   Gastrointestinal: Negative.   Endocrine: Negative for cold intolerance, heat intolerance, polydipsia, polyphagia and polyuria.  Genitourinary: Negative.   Musculoskeletal: Positive for arthralgias and back pain. Negative for gait problem, joint swelling, myalgias, neck pain and neck stiffness.  Skin: Negative.   Neurological: Negative.   Hematological: Negative.   Psychiatric/Behavioral: Negative.   All other systems reviewed and are negative.   Social History He  reports that he has never smoked. He has never used smokeless tobacco. He reports current alcohol use. He reports that he does  not use drugs. Social History   Socioeconomic History  . Marital status: Married    Spouse name: Not on file  . Number of children: Not on file  . Years of education: Not on file  . Highest education level: Not on file  Occupational History  . Not on file  Tobacco Use  . Smoking status: Never Smoker  . Smokeless tobacco: Never Used  Substance and Sexual Activity  . Alcohol  use: Yes    Comment: 1-2/week  . Drug use: No  . Sexual activity: Not on file  Other Topics Concern  . Not on file  Social History Narrative  . Not on file   Social Determinants of Health   Financial Resource Strain:   . Difficulty of Paying Living Expenses: Not on file  Food Insecurity:   . Worried About Programme researcher, broadcasting/film/video in the Last Year: Not on file  . Ran Out of Food in the Last Year: Not on file  Transportation Needs:   . Lack of Transportation (Medical): Not on file  . Lack of Transportation (Non-Medical): Not on file  Physical Activity:   . Days of Exercise per Week: Not on file  . Minutes of Exercise per Session: Not on file  Stress:   . Feeling of Stress : Not on file  Social Connections:   . Frequency of Communication with Friends and Family: Not on file  . Frequency of Social Gatherings with Friends and Family: Not on file  . Attends Religious Services: Not on file  . Active Member of Clubs or Organizations: Not on file  . Attends Banker Meetings: Not on file  . Marital Status: Not on file    There are no problems to display for this patient.   Past Surgical History:  Procedure Laterality Date  . COLONOSCOPY  2011    Family History  Family Status  Relation Name Status  . Neg Hx  (Not Specified)   His family history is not on file.     No Known Allergies  Previous Medications   IBUPROFEN (ADVIL,MOTRIN) 800 MG TABLET    Take 1 tablet (800 mg total) by mouth 3 (three) times daily.   MELOXICAM PO    Take by mouth.   METHOCARBAMOL PO    Take by mouth.  he reports he is not taking the above medications. Denies need for refill.  Patient Care Team: Berniece Pap, FNP as PCP - General (Family Medicine)      Objective:   Blood pressure 122/86, pulse 80, temperature (!) 97.3 F (36.3 C), temperature source temporal , weight 197 lb (89.4 kg).  Body mass index is 29.95 kg/m.  Physical Exam Vitals and nursing note reviewed.   Constitutional:      General: He is not in acute distress.    Appearance: Normal appearance. He is well-developed. He is not ill-appearing, toxic-appearing or diaphoretic.     Comments: Patient is alert and oriented and responsive to questions Engages in eye contact with provider. Speaks in full sentences without any pauses without any shortness of breath or distress.    HENT:     Head: Normocephalic and atraumatic.     Right Ear: Hearing, tympanic membrane, ear canal and external ear normal.     Left Ear: Hearing, tympanic membrane, ear canal and external ear normal.     Nose: Nose normal. No congestion or rhinorrhea.     Mouth/Throat:     Mouth: Mucous membranes are  moist.     Pharynx: Uvula midline. No oropharyngeal exudate or posterior oropharyngeal erythema.  Eyes:     General: Lids are normal. No scleral icterus.       Right eye: No discharge.        Left eye: No discharge.     Extraocular Movements: Extraocular movements intact.     Conjunctiva/sclera: Conjunctivae normal.     Pupils: Pupils are equal, round, and reactive to light.  Neck:     Thyroid: No thyromegaly.     Vascular: Normal carotid pulses. No carotid bruit, hepatojugular reflux or JVD.     Trachea: Trachea and phonation normal. No tracheal tenderness or tracheal deviation.     Meningeal: Brudzinski's sign absent.  Cardiovascular:     Rate and Rhythm: Normal rate and regular rhythm.     Pulses: Normal pulses.     Heart sounds: Normal heart sounds, S1 normal and S2 normal. Heart sounds not distant. No murmur. No friction rub. No gallop.   Pulmonary:     Effort: Pulmonary effort is normal. No accessory muscle usage or respiratory distress.     Breath sounds: Normal breath sounds. No stridor. No wheezing or rales.  Chest:     Chest wall: No tenderness.  Abdominal:     General: Bowel sounds are normal. There is no distension or abdominal bruit. There are no signs of injury.     Palpations: Abdomen is soft. There  is no mass.     Tenderness: There is no abdominal tenderness. There is no right CVA tenderness, left CVA tenderness, guarding or rebound. Negative signs include Murphy's sign, Rovsing's sign, McBurney's sign, psoas sign and obturator sign.     Hernia: No hernia is present. There is no hernia in the umbilical area or ventral area.  Genitourinary:    Comments: Declined recommended exam.  Musculoskeletal:        General: No tenderness or deformity. Normal range of motion.     Cervical back: Full passive range of motion without pain, normal range of motion and neck supple.     Comments: Patient moves on and off of exam table and in room without difficulty. Gait is normal in hall and in room. Patient is oriented to person place time and situation. Patient answers questions appropriately and engages in conversation.  Range of motion is normal.   Lymphadenopathy:     Head:     Right side of head: No submental, submandibular, tonsillar, preauricular, posterior auricular or occipital adenopathy.     Left side of head: No submental, submandibular, tonsillar, preauricular, posterior auricular or occipital adenopathy.     Cervical: No cervical adenopathy.  Skin:    General: Skin is warm and dry.     Capillary Refill: Capillary refill takes less than 2 seconds.     Coloration: Skin is not pale.     Findings: No erythema or rash.     Nails: There is no clubbing.  Neurological:     General: No focal deficit present.     Mental Status: He is alert and oriented to person, place, and time. Mental status is at baseline.     GCS: GCS eye subscore is 4. GCS verbal subscore is 5. GCS motor subscore is 6.     Cranial Nerves: Cranial nerves are intact. No cranial nerve deficit.     Sensory: Sensation is intact. No sensory deficit.     Motor: Motor function is intact. No weakness or abnormal muscle tone.  Coordination: Coordination is intact. Coordination normal.     Gait: Gait is intact. Gait normal.      Deep Tendon Reflexes: Reflexes are normal and symmetric. Reflexes normal.     Reflex Scores:      Tricep reflexes are 2+ on the right side and 2+ on the left side.      Bicep reflexes are 2+ on the right side and 2+ on the left side.      Brachioradialis reflexes are 2+ on the right side and 2+ on the left side.      Patellar reflexes are 2+ on the right side and 2+ on the left side.      Achilles reflexes are 2+ on the right side and 2+ on the left side. Psychiatric:        Attention and Perception: Attention and perception normal.        Mood and Affect: Mood and affect normal.        Speech: Speech normal.        Behavior: Behavior normal. Behavior is cooperative.        Thought Content: Thought content normal.        Cognition and Memory: Cognition and memory normal.        Judgment: Judgment normal.      Depression Screen Depression screen [facility] 2/9 12/11/2019  Decreased Interest 1  Down, Depressed, Hopeless 1  PHQ - 2 Score 2  Altered sleeping 0  Tired, decreased energy 1  Change in appetite 0  Feeling bad or failure about yourself  0  Trouble concentrating 0  Moving slowly or fidgety/restless 0  Suicidal thoughts 0  PHQ-9 Score 3  Difficult doing work/chores Somewhat difficult      Assessment & Plan:     Routine Health Maintenance and Physical Exam  Exercise Activities and Dietary recommendations Goals   None      There is no immunization history on file for this patient.  Health Maintenance  Topic Date Due  . HIV Screening  08/16/2006  . TETANUS/TDAP  08/16/2010  . INFLUENZA VACCINE  05/19/2019   Advised yearly dental and vision exams.   Orders Placed This Encounter  Procedures  . TSH  . Lipid panel  . CBC with Differential/Platelet  . Comprehensive Metabolic Panel (CMET)  . Ambulatory referral to Orthopedic Surgery  . POCT urinalysis dipstick    Records request obtained.    Discussed health benefits of physical activity, and encouraged him  to engage in regular exercise appropriate for his age and condition.   Routine adult health maintenance - Plan: POCT urinalysis dipstick  Right hip pain - Plan: Ambulatory referral to Orthopedic Surgery, CBC with Differential/Platelet, Comprehensive Metabolic Panel (CMET)  Chronic bilateral low back pain with right-sided sciatica - Plan: Ambulatory referral to Orthopedic Surgery, CBC with Differential/Platelet, Comprehensive Metabolic Panel (CMET)  Screening for lipid disorders - Plan: Lipid panel  Screening for thyroid disorder - Plan: TSH  Body mass index 29.0-29.9, adult  Weight loss, proper body mechanics, healthy diet recommended.  referred to [facility] for back pain, he has had imaging done he reports MRI and X ray at [facility] - unable to see those results. Patient reports they were normal. Since he has seen specialty prefer him to be evaluated there again prior to going to Physical  therapy. Concerned with groin tightness and right leg paresthesias that are both intermittent and chronic for over one year. Records requested,. He denies ever being referred to Neurosurgery.  Reviewed RED Flag symptoms of back pain/ hip pain and when to seek care immediately.   Return in 3 months (on 03/09/2020).  Advised patient call the office or your primary care doctor for an appointment if no improvement within 72 hours or if any symptoms change or worsen at any time  Advised ER or urgent Care if after hours or on weekend. Call 911 for emergency symptoms at any time.Patinet verbalized understanding of all instructions given/reviewed and treatment plan and has no further questions or concerns at this time.    An After Visit Summary was printed and given to the patient.  Patient verbalized understanding of all instructions given and denies any further questions at this time.   The entirety of the information documented in the History of Present Illness, Review of Systems and Physical  Exam were personally obtained by me. Portions of this information were initially documented by the  Certified Medical Assistant whose name is documented in Epic and reviewed by me for thoroughness and accuracy.  I have personally performed the exam and reviewed the chart and it is accurate to the best of my knowledge.  Museum/gallery conservator has been used and any errors in dictation or transcription are unintentional.  Eula Fried. Flinchum FNP-C  [facility]  --------------------------------------------------------------------

## 2019-12-24 ENCOUNTER — Telehealth: Payer: Self-pay

## 2019-12-24 DIAGNOSIS — M5441 Lumbago with sciatica, right side: Secondary | ICD-10-CM

## 2019-12-24 DIAGNOSIS — G8929 Other chronic pain: Secondary | ICD-10-CM

## 2019-12-24 NOTE — Telephone Encounter (Signed)
Copied from CRM [id number]. Topic: General - Other >> Dec 24, 2019  2:39 PM Jaquita Rector A wrote: Reason for CRM: Patient called to say that he would like a referral to be sent to [facility] Ph# [phone] they need a referral from Dr Ashok Norris for patient to attend Patient can be reached at Ph# [phone]

## 2019-12-24 NOTE — Telephone Encounter (Signed)
At visit 12/11/19 he mentioned chronic history of hip/groin pain since 2018,please advise if okay to put in referral for PT, I see you did refer patient to [facility]. KW

## 2019-12-25 NOTE — Telephone Encounter (Signed)
Yes, would like him to see orthopedics for evaluation prior to therapy and they would then place order for therapy or mention in note to [place].

## 2019-12-26 NOTE — Telephone Encounter (Signed)
Did he see a orthopedic provider at [facility] for evaluation ? I do not see anything in care everywhere, can he provide [place] a release to get copy of the orthopedic evaluation. Then we can place a referral to PT for this at the location he requests.

## 2019-12-26 NOTE — Telephone Encounter (Signed)
Attempted to call pt, no answer, left message to call back regarding his referral.

## 2019-12-26 NOTE — Telephone Encounter (Signed)
[id number] 

## 2019-12-26 NOTE — Telephone Encounter (Signed)
Please advise if another referral would have to be placed. KW

## 2019-12-26 NOTE — Telephone Encounter (Signed)
Pt. Called back and reports he did go to [facility] and "was not happy with the therapist." Would like to go back to [facility] of possible. Please advise pt.

## 2019-12-26 NOTE — Telephone Encounter (Signed)
Patient states that he saw a man named Onalee Hua when he went to [facility], I advised patient to return back to [facility] and fill out a medical release form so we can obtain records and review before placing order for PT, patient voiced understanding. KW

## 2020-01-03 NOTE — Telephone Encounter (Signed)
Pt calling to check. Pt would like to know if we have received records so that he can put a new referral for PT.

## 2020-01-03 NOTE — Telephone Encounter (Signed)
I see his medical records from 12/21/19 are scanned in from [facility], please review. KW

## 2020-01-04 NOTE — Telephone Encounter (Signed)
Yes ok to place referral for physical therapy to patients choice of place.

## 2020-01-04 NOTE — Telephone Encounter (Signed)
I do see the note with a physical therapist, it is ok to place an order for therapy, however my recommendation that was discussed with patient was for his to see an orthopedic doctor prior, I do not see any note with a provider.

## 2020-01-04 NOTE — Addendum Note (Signed)
Addended by: Fonda Kinder on: 01/04/2020 04:52 PM   Modules accepted: Orders

## 2020-01-04 NOTE — Telephone Encounter (Signed)
I informed patient that we had received records from [facility], I clarified with him about need to see a orthopedic doctor for further imaging to make sure there has been no changes. Patient states that records that we received from [facility] were from the orthopedic doctor, patient states that [facility] has already done images as well. Patient would like for you to approve PT in [place]. KW

## 2020-01-26 ENCOUNTER — Ambulatory Visit: Payer: 59 | Attending: Internal Medicine

## 2020-01-26 DIAGNOSIS — Z23 Encounter for immunization: Secondary | ICD-10-CM

## 2020-01-26 NOTE — Progress Notes (Signed)
   Covid-19 Vaccination Clinic  Name:  Douglas Frank    MRN: [id number] DOB: Jun 14, 1991  01/26/2020  Mr. Douglas Frank was observed post Covid-19 immunization for 15 minutes without incident. He was provided with Vaccine Information Sheet and instruction to access the V-Safe system.   Mr. Douglas Frank was instructed to call 911 with any severe reactions post vaccine: Marland Kitchen Difficulty breathing  . Swelling of face and throat  . A fast heartbeat  . A bad rash all over body  . Dizziness and weakness   Immunizations Administered    Name Date Dose VIS Date Route   Moderna COVID-19 Vaccine 01/26/2020  1:50 PM 0.5 mL 09/18/2019 Intramuscular   Manufacturer: Moderna   Lot: [id number]   NDC: 61537-943-27

## 2020-01-28 ENCOUNTER — Encounter: Payer: Self-pay | Admitting: Emergency Medicine

## 2020-01-28 ENCOUNTER — Other Ambulatory Visit: Payer: Self-pay

## 2020-01-28 ENCOUNTER — Emergency Department
Admission: EM | Admit: 2020-01-28 | Discharge: 2020-01-29 | Disposition: A | Discharge status: Home / Self Care | Payer: 59 | Attending: Emergency Medicine | Admitting: Emergency Medicine

## 2020-01-28 ENCOUNTER — Emergency Department: Payer: 59

## 2020-01-28 DIAGNOSIS — R109 Unspecified abdominal pain: Secondary | ICD-10-CM | POA: Diagnosis present

## 2020-01-28 DIAGNOSIS — N200 Calculus of kidney: Secondary | ICD-10-CM | POA: Diagnosis not present

## 2020-01-28 DIAGNOSIS — Z79899 Other long term (current) drug therapy: Secondary | ICD-10-CM | POA: Diagnosis not present

## 2020-01-28 DIAGNOSIS — Z20822 Contact with and (suspected) exposure to covid-19: Secondary | ICD-10-CM | POA: Diagnosis not present

## 2020-01-28 LAB — HEPATIC FUNCTION PANEL
ALT: 42 U/L (ref 0–44)
AST: 38 U/L (ref 15–41)
Albumin: 4 g/dL (ref 3.5–5.0)
Alkaline Phosphatase: 50 U/L (ref 38–126)
Bilirubin, Direct: 0.2 mg/dL (ref 0.0–0.2)
Indirect Bilirubin: 0.8 mg/dL (ref 0.3–0.9)
Total Bilirubin: 1 mg/dL (ref 0.3–1.2)
Total Protein: 7.3 g/dL (ref 6.5–8.1)

## 2020-01-28 LAB — URINE DRUG SCREEN, QUALITATIVE (ARMC ONLY)
Amphetamines, Ur Screen: NOT DETECTED
Barbiturates, Ur Screen: NOT DETECTED
Benzodiazepine, Ur Scrn: NOT DETECTED
Cannabinoid 50 Ng, Ur ~~LOC~~: NOT DETECTED
Cocaine Metabolite,Ur ~~LOC~~: NOT DETECTED
MDMA (Ecstasy)Ur Screen: NOT DETECTED
Methadone Scn, Ur: NOT DETECTED
Opiate, Ur Screen: NOT DETECTED
Phencyclidine (PCP) Ur S: NOT DETECTED
Tricyclic, Ur Screen: NOT DETECTED

## 2020-01-28 LAB — URINALYSIS, COMPLETE (UACMP) WITH MICROSCOPIC
Bacteria, UA: NONE SEEN
Bilirubin Urine: NEGATIVE
Glucose, UA: NEGATIVE mg/dL
Ketones, ur: 5 mg/dL — AB
Leukocytes,Ua: NEGATIVE
Nitrite: NEGATIVE
Protein, ur: 30 mg/dL — AB
RBC / HPF: >50 RBC/hpf — ABNORMAL HIGH (ref 0–5)
Specific Gravity, Urine: 1.043 — ABNORMAL HIGH (ref 1.005–1.030)
pH: 5 (ref 5.0–8.0)

## 2020-01-28 LAB — BASIC METABOLIC PANEL
Anion gap: 11 (ref 5–15)
BUN: 9 mg/dL (ref 6–20)
CO2: 22 mmol/L (ref 22–32)
Calcium: 8.9 mg/dL (ref 8.9–10.3)
Chloride: 104 mmol/L (ref 98–111)
Creatinine, Ser: 1.07 mg/dL (ref 0.61–1.24)
GFR calc Af Amer: >60 mL/min (ref >60)
GFR calc non Af Amer: >60 mL/min (ref >60)
Glucose, Bld: 126 mg/dL — ABNORMAL HIGH (ref 70–99)
Potassium: 3.6 mmol/L (ref 3.5–5.1)
Sodium: 137 mmol/L (ref 135–145)

## 2020-01-28 LAB — CBC
HCT: 42.3 % (ref 39.0–52.0)
Hemoglobin: 14.7 g/dL (ref 13.0–17.0)
MCH: 29.6 pg (ref 26.0–34.0)
MCHC: 34.8 g/dL (ref 30.0–36.0)
MCV: 85.3 fL (ref 80.0–100.0)
Platelets: 338 10*3/uL (ref 150–400)
RBC: 4.96 MIL/uL (ref 4.22–5.81)
RDW: 12.6 % (ref 11.5–15.5)
WBC: 7.7 10*3/uL (ref 4.0–10.5)
nRBC: 0 % (ref 0.0–0.2)

## 2020-01-28 LAB — LIPASE, BLOOD: Lipase: 17 U/L (ref 11–51)

## 2020-01-28 MED ORDER — SODIUM CHLORIDE 0.9 % IV BOLUS: 1000.0000 mL | Freq: Once | INTRAVENOUS | Status: DC

## 2020-01-28 MED ORDER — HYDROMORPHONE HCL 1 MG/ML IJ SOLN
1.0000 mg | Freq: Once | INTRAMUSCULAR | Status: AC
  Administered 2020-01-28: 1 mg via INTRAVENOUS
  Filled 2020-01-28: qty 1

## 2020-01-28 MED ORDER — MELOXICAM 15 MG PO TABS: 15.0000 mg | ORAL_TABLET | Freq: Every day | ORAL | 0 refills | Status: AC

## 2020-01-28 MED ORDER — SODIUM CHLORIDE 0.9 % IV BOLUS
1000.0000 mL | Freq: Once | INTRAVENOUS | Status: AC
  Administered 2020-01-28: 1000 mL via INTRAVENOUS

## 2020-01-28 MED ORDER — ONDANSETRON HCL 4 MG/2ML IJ SOLN
4.0000 mg | Freq: Once | INTRAMUSCULAR | Status: AC
  Administered 2020-01-28: 4 mg via INTRAVENOUS
  Filled 2020-01-28: qty 2

## 2020-01-28 MED ORDER — TAMSULOSIN HCL 0.4 MG PO CAPS: 0.4000 mg | ORAL_CAPSULE | Freq: Every day | ORAL | 0 refills | Status: DC

## 2020-01-28 MED ORDER — FENTANYL CITRATE (PF) 100 MCG/2ML IJ SOLN
50.0000 ug | INTRAMUSCULAR | Status: DC | PRN
  Administered 2020-01-28: 50 ug via INTRAVENOUS
  Filled 2020-01-28: qty 2

## 2020-01-28 MED ORDER — IOHEXOL 300 MG/ML  SOLN
100.0000 mL | Freq: Once | INTRAMUSCULAR | Status: AC | PRN
  Administered 2020-01-28: 100 mL via INTRAVENOUS
  Filled 2020-01-28: qty 100

## 2020-01-28 MED ORDER — FENTANYL CITRATE (PF) 100 MCG/2ML IJ SOLN
100.0000 ug | Freq: Once | INTRAMUSCULAR | Status: AC
  Administered 2020-01-28: 100 ug via INTRAVENOUS
  Filled 2020-01-28: qty 2

## 2020-01-28 MED ORDER — ONDANSETRON 4 MG PO TBDP: 4.0000 mg | ORAL_TABLET | Freq: Three times a day (TID) | ORAL | 0 refills | Status: AC | PRN

## 2020-01-28 MED ORDER — OXYCODONE-ACETAMINOPHEN 5-325 MG PO TABS: 1.0000 | ORAL_TABLET | Freq: Four times a day (QID) | ORAL | 0 refills | Status: AC | PRN

## 2020-01-28 NOTE — ED Triage Notes (Signed)
Right flank pain since 2pm. Says he was laying down.  hjappende on Saturday, but it got better.

## 2020-01-28 NOTE — ED Notes (Signed)
Redraw lt green completed and sent to lab   lw edt

## 2020-01-28 NOTE — ED Provider Notes (Signed)
[facility] Emergency Department Provider Note  ____________________________________________  Time seen: Approximately 7:01 PM  I have reviewed the triage vital signs and the nursing notes.   HISTORY  Chief Complaint Flank Pain    HPI Douglas Frank is a 29 y.o. male who presents the emergency department for sharp flank and right lower quadrant abdominal pain.  Patient states that 2 days ago he awoke with sudden sharp pain in his back.  This eventually eased.  Patient states that he was mowing the grass today when he had sharp return of the pain.  Patient states that the pain was so severe that he had to call EMS as he was feeling very nauseated and sick due to the pain.  Patient states that he had an injury 2 years ago while at work.  He had an injury to his right groin that has never healed appropriately.  Pain is not in his groin, it is in his flank region radiating into his right lower abdomen.  Patient states that he has been nauseated, having multiple episodes of emesis this afternoon after pain returned.  No reported fevers or chills, diarrhea or constipation.  No dysuria, polyuria, hematuria.         History reviewed. No pertinent past medical history.  Patient Active Problem List   Diagnosis Date Noted  . Screening for thyroid disorder 12/11/2019  . Right hip pain 12/11/2019  . Chronic bilateral low back pain with right-sided sciatica 12/11/2019  . Body mass index 29.0-29.9, adult 12/11/2019    Past Surgical History:  Procedure Laterality Date  . COLONOSCOPY  2011    Prior to Admission medications   Medication Sig Start Date End Date Taking? Authorizing Provider  meloxicam (MOBIC) 15 MG tablet Take 1 tablet (15 mg total) by mouth daily. 01/28/20   Kaevon Cotta, Delorise Royals, PA-C  ondansetron (ZOFRAN-ODT) 4 MG disintegrating tablet Take 1 tablet (4 mg total) by mouth every 8 (eight) hours as needed for nausea or vomiting. 01/28/20   Connell Bognar,  Delorise Royals, PA-C  oxyCODONE-acetaminophen (PERCOCET/ROXICET) 5-325 MG tablet Take 1 tablet by mouth every 6 (six) hours as needed for severe pain. 01/28/20   Salar Molden, Delorise Royals, PA-C  tamsulosin (FLOMAX) 0.4 MG CAPS capsule Take 1 capsule (0.4 mg total) by mouth daily. 01/28/20   Lydon Vansickle, Delorise Royals, PA-C    Allergies Other  Family History  Problem Relation Age of Onset  . Hypertension Mother   . Cancer Maternal Aunt   . Cancer Maternal Uncle   . Prostate cancer Neg Hx   . Kidney cancer Neg Hx   . Kidney failure Neg Hx   . Sickle cell anemia Neg Hx   . Tuberculosis Neg Hx     Social History Social History   Tobacco Use  . Smoking status: Never Smoker  . Smokeless tobacco: Never Used  Substance Use Topics  . Alcohol use: Yes    Comment: 1-2/week  . Drug use: No     Review of Systems  Constitutional: No fever/chills Eyes: No visual changes. No discharge ENT: No upper respiratory complaints. Cardiovascular: no chest pain. Respiratory: no cough. No SOB. Gastrointestinal: Positive for right flank pain radiating into the right lower quadrant..  No nausea, no vomiting.  No diarrhea.  No constipation. Genitourinary: Negative for dysuria. No hematuria Musculoskeletal: Negative for musculoskeletal pain. Skin: Negative for rash, abrasions, lacerations, ecchymosis. Neurological: Negative for headaches, focal weakness or numbness. 10-point ROS otherwise negative.  ____________________________________________   PHYSICAL EXAM:  VITAL  SIGNS: ED Triage Vitals [01/28/20 1633]  Enc Vitals Group     BP (!) 97/59     Pulse Rate 88     Resp 18     Temp 97.7 F (36.5 C)     Temp Source Oral     SpO2 100 %     Weight 185 lb (83.9 kg)     Height 5\' 9"  (1.753 m)     Head Circumference      Peak Flow      Pain Score 8     Pain Loc      Pain Edu?      Excl. in GC?      Constitutional: Alert and oriented. Well appearing and in no acute distress. Eyes: Conjunctivae are  normal. PERRL. EOMI. Head: Atraumatic. ENT:      Ears:       Nose: No congestion/rhinnorhea.      Mouth/Throat: Mucous membranes are moist.  Neck: No stridor.    Cardiovascular: Normal rate, regular rhythm. Normal S1 and S2.  Good peripheral circulation. Respiratory: Normal respiratory effort without tachypnea or retractions. Lungs CTAB. Good air entry to the bases with no decreased or absent breath sounds. Gastrointestinal: Bowel sounds 4 quadrants.  Soft to palpation all quadrants.  Patient is tender on palpation of the right quadrant and over McBurney's point.  No significant rebound tenderness at this time.  No other significant abdominal tenderness to palpation.. No guarding or rigidity. No palpable masses. No distention. No CVA tenderness. Musculoskeletal: Full range of motion to all extremities. No gross deformities appreciated.  Examination of the thoracic and lumbar spine reveals no visible signs of trauma.  No midline tenderness.  Patient is having right lower thoracic and right upper lumbar paraspinal muscle tenderness on exam.  Mild right-sided CVA tenderness as well.  No tenderness to palpation over the right-sided sciatic notch.  Examination of bilateral hips, bilateral knees bilateral ankles is unremarkable.  Dorsalis pedis pulse intact bilateral lower extremities.  Sensation intact and equal in all dermatomal distributions bilateral lower extremities. Neurologic:  Normal speech and language. No gross focal neurologic deficits are appreciated.  Skin:  Skin is warm, dry and intact. No rash noted. Psychiatric: Mood and affect are normal. Speech and behavior are normal. Patient exhibits appropriate insight and judgement.   ____________________________________________   LABS (all labs ordered are listed, but only abnormal results are displayed)  Labs Reviewed  URINALYSIS, COMPLETE (UACMP) WITH MICROSCOPIC - Abnormal; Notable for the following components:      Result Value   Color,  Urine YELLOW (*)    APPearance CLOUDY (*)    Specific Gravity, Urine 1.043 (*)    Hgb urine dipstick LARGE (*)    Ketones, ur 5 (*)    Protein, ur 30 (*)    RBC / HPF >50 (*)    All other components within normal limits  BASIC METABOLIC PANEL - Abnormal; Notable for the following components:   Glucose, Bld 126 (*)    All other components within normal limits  CBC  URINE DRUG SCREEN, QUALITATIVE ([facility] ONLY)  HEPATIC FUNCTION PANEL  LIPASE, BLOOD   ____________________________________________  EKG   ____________________________________________  RADIOLOGY I personally viewed and evaluated these images as part of my medical decision making, as well as reviewing the written report by the radiologist.  CT ABDOMEN PELVIS W CONTRAST  Result Date: 01/28/2020 CLINICAL DATA:  Right lower quadrant pain EXAM: CT ABDOMEN AND PELVIS WITH CONTRAST TECHNIQUE: Multidetector CT imaging  of the abdomen and pelvis was performed using the standard protocol following bolus administration of intravenous contrast. CONTRAST:  OMNIPAQUE IOHEXOL 300 MG/ML  SOLN COMPARISON:  None. FINDINGS: Lower chest: No acute abnormality. Hepatobiliary: Hepatic steatosis with fat sparing near the gallbladder fossa. No calcified stone or biliary dilatation. Pancreas: Unremarkable. No pancreatic ductal dilatation or surrounding inflammatory changes. Spleen: Normal in size without focal abnormality. Adrenals/Urinary Tract: Adrenal glands are normal. Mild right hydronephrosis and proximal hydroureter, secondary to a 2-3 mm stone in the proximal right ureter at the L3 level. Bladder is normal Stomach/Bowel: Stomach is within normal limits. Appendix appears normal. No evidence of bowel wall thickening, distention, or inflammatory changes. Vascular/Lymphatic: No significant vascular findings are present. No enlarged abdominal or pelvic lymph nodes. Reproductive: Prostate is unremarkable. Other: Negative for free air or free fluid  Musculoskeletal: No acute or significant osseous findings. IMPRESSION: 1. Mild right hydronephrosis and hydroureter, secondary to a 2-3 mm stone in the proximal right ureter at the L3 level. 2. Negative for acute appendicitis 3. Hepatic steatosis Electronically Signed   By: Jasmine Pang M.D.   On: 01/28/2020 21:50    ____________________________________________    PROCEDURES  Procedure(s) performed:    Procedures    Medications  fentaNYL (SUBLIMAZE) injection 50 mcg (50 mcg Intravenous Given 01/28/20 1648)  ondansetron (ZOFRAN) injection 4 mg (4 mg Intravenous Given 01/28/20 1648)  sodium chloride 0.9 % bolus 1,000 mL (1,000 mLs Intravenous New Bag/Given 01/28/20 2152)  fentaNYL (SUBLIMAZE) injection 100 mcg (100 mcg Intravenous Given 01/28/20 2024)  iohexol (OMNIPAQUE) 300 MG/ML solution 100 mL (100 mLs Intravenous Contrast Given 01/28/20 2124)  HYDROmorphone (DILAUDID) injection 1 mg (1 mg Intravenous Given 01/28/20 2152)  HYDROmorphone (DILAUDID) injection 1 mg (1 mg Intravenous Given 01/28/20 2254)     ____________________________________________   INITIAL IMPRESSION / ASSESSMENT AND PLAN / ED COURSE  Pertinent labs & imaging results that were available during my care of the patient were reviewed by me and considered in my medical decision making (see chart for details).  Review of the [place] CSRS was performed in accordance of the [facility] prior to dispensing any controlled drugs.           Patient's diagnosis is consistent with nephrolithiasis.  Patient presented to the emergency department with right sided flank and right lower quadrant abdominal pain.  Sudden onset.  No trauma.  Patient has a chronic kidney injury but states that the pain is not similar nor in the same location as his normal pain.  Patient had no other symptoms of fevers or chills, nausea vomiting, diarrhea or constipation.  No dysuria, polyuria, hematuria.  Given patient's complaint, significant pain patient was  imaged with CT scan.  This identified a 2 to 3 mm stone in the proximal ureter with mild hydronephrosis.  At this time will place on Percocet, Zofran, meloxicam, Flomax.  I instructed the patient to follow-up with urology to discuss management techniques including lithotripsy.  Return precautions are discussed with the patient..  Patient is given ED precautions to return to the ED for any worsening or new symptoms.     ____________________________________________  FINAL CLINICAL IMPRESSION(S) / ED DIAGNOSES  Final diagnoses:  Right nephrolithiasis      NEW MEDICATIONS STARTED DURING THIS VISIT:  ED Discharge Orders         Ordered    oxyCODONE-acetaminophen (PERCOCET/ROXICET) 5-325 MG tablet  Every 6 hours PRN     01/28/20 2245    ondansetron (ZOFRAN-ODT) 4 MG  disintegrating tablet  Every 8 hours PRN     01/28/20 2245    tamsulosin (FLOMAX) 0.4 MG CAPS capsule  Daily     01/28/20 2245    meloxicam (MOBIC) 15 MG tablet  Daily     01/28/20 2245              This chart was dictated using voice recognition software/Dragon. Despite best efforts to proofread, errors can occur which can change the meaning. Any change was purely unintentional.    Darletta Moll, PA-C 01/29/20 0026    Arta Silence, MD 01/29/20 1500

## 2020-01-28 NOTE — ED Notes (Signed)
First RN: pt from home via ems- was mowing lawn and suddenly began to feel lightheaded and dizzy. CBG- 82

## 2020-01-28 NOTE — ED Notes (Signed)
Pt provided urinal for urine sample. States he does not need to urinate at this time.

## 2020-01-28 NOTE — ED Notes (Signed)
Called to subwait.  Patient is vomiting small amount. Says his pain got better after med, but now it is back to a 9--right flank.

## 2020-01-29 ENCOUNTER — Ambulatory Visit
Admission: RE | Admit: 2020-01-29 | Discharge: 2020-01-29 | Disposition: A | Discharge status: Home / Self Care | Payer: 59 | Source: Ambulatory Visit | Attending: Urology | Admitting: Urology

## 2020-01-29 ENCOUNTER — Encounter: Payer: Self-pay | Admitting: Urology

## 2020-01-29 ENCOUNTER — Other Ambulatory Visit: Payer: Self-pay

## 2020-01-29 ENCOUNTER — Ambulatory Visit: Payer: 59 | Admitting: Urology

## 2020-01-29 ENCOUNTER — Ambulatory Visit
Admission: RE | Admit: 2020-01-29 | Discharge: 2020-01-29 | Disposition: A | Discharge status: Home / Self Care | Payer: 59 | Source: Home / Self Care | Attending: Urology | Admitting: Urology

## 2020-01-29 ENCOUNTER — Other Ambulatory Visit
Admission: RE | Admit: 2020-01-29 | Discharge: 2020-01-29 | Disposition: A | Discharge status: Home / Self Care | Payer: 59 | Source: Ambulatory Visit | Attending: Urology | Admitting: Urology

## 2020-01-29 ENCOUNTER — Ambulatory Visit (INDEPENDENT_AMBULATORY_CARE_PROVIDER_SITE_OTHER): Payer: 59 | Admitting: Urology

## 2020-01-29 VITALS — BP 127/72 | HR 72 | Wt 197.0 lb

## 2020-01-29 DIAGNOSIS — N201 Calculus of ureter: Secondary | ICD-10-CM | POA: Diagnosis not present

## 2020-01-29 DIAGNOSIS — N200 Calculus of kidney: Secondary | ICD-10-CM

## 2020-01-29 NOTE — Progress Notes (Signed)
   01/29/2020 1:13 PM   Douglas Frank 03-13-1991 [id number]  Reason for visit: Right ureteral stone  HPI: I saw Douglas Frank in urology clinic as an add-on for right renal colic.  He is a 29 year old healthy male that was seen over a year ago by Dr. Lonna Cobb for musculoskeletal groin pain who developed acute onset of right-sided severe renal colic last night necessitating ER visit.  CT at that time showed a 4 mm right proximal ureteral stone with upstream hydronephrosis.  There were no other stones seen.  He also has had some nausea.  He denies any fevers or chills.  Urinalysis was notable for microscopic hematuria, but he had no leukocytosis and renal function was normal.  He was discharged with Flomax and pain medications.  He called the office today with ongoing severe right-sided pain.  He is otherwise healthy.  He denies any prior stone episodes.   Physical Exam: BP 127/72 (BP Location: Left Arm, Patient Position: Sitting, Cuff Size: Normal)   Pulse 72   Wt 197 lb (89.4 kg)   BMI 29.09 kg/m    Constitutional: Tearful, appears uncomfortable Cardiac: RRR Lungs: CTA bilaterally GU: right CVA tenderness  Laboratory Data: Reviewed, see HPI Calcium normal at 8.9.  Pertinent Imaging: I have personally reviewed the CT from last night as well as the KUB from today.  There is a 4 mm right mid to proximal ureteral stone with upstream hydronephrosis.  Stone is clearly seen on KUB. 700HU, 7cm SSD.  Assessment & Plan:   In summary, he is a healthy 29 year old male with poorly controlled pain secondary to a 4 mm right proximal ureteral stone.  He has no clinical or laboratory signs of infection.    We discussed various treatment options for urolithiasis including observation with or without medical expulsive therapy, shockwave lithotripsy (SWL), ureteroscopy and laser lithotripsy with stent placement, and percutaneous nephrolithotomy.  We discussed that management is based on stone  size, location, density, patient co-morbidities, and patient preference.   Stones <50mm in size have a >80% spontaneous passage rate. Data surrounding the use of tamsulosin for medical expulsive therapy is controversial, but meta analyses suggests it is most efficacious for distal stones between 5-77mm in size. Possible side effects include dizziness/lightheadedness, and retrograde ejaculation.  SWL has a lower stone free rate in a single procedure, but also a lower complication rate compared to ureteroscopy and avoids a stent and associated stent related symptoms. Possible complications include renal hematoma, steinstrasse, and need for additional treatment.  Ureteroscopy with laser lithotripsy and stent placement has a higher stone free rate than SWL in a single procedure, however increased complication rate including possible infection, ureteral injury, bleeding, and stent related morbidity. Common stent related symptoms include dysuria, urgency/frequency, and flank pain.  After an extensive discussion of the risks and benefits of the above treatment options, the patient would like to proceed with right shockwave lithotripsy.  Continue medical expulsive therapy, strain urine Schedule right shockwave lithotripsy on Thursday 4/15  Sondra Come, MD  [facility] [address] [place], [place] [address] [phone]

## 2020-01-29 NOTE — Progress Notes (Signed)
kub

## 2020-01-29 NOTE — Patient Instructions (Addendum)
Please do not take any meloxicam or NSAIDS (ibuprofen, aleve, ect)   Lithotripsy  Lithotripsy is a treatment that can sometimes help eliminate kidney stones and the pain that they cause. A form of lithotripsy, also known as extracorporeal shock wave lithotripsy, is a nonsurgical procedure that crushes a kidney stone with shock waves. These shock waves pass through your body and focus on the kidney stone. They cause the kidney stone to break up while it is still in the urinary tract. This makes it easier for the smaller pieces of stone to pass in the urine. Tell a health care provider about:  Any allergies you have.  All medicines you are taking, including vitamins, herbs, eye drops, creams, and over-the-counter medicines.  Any blood disorders you have.  Any surgeries you have had.  Any medical conditions you have.  Whether you are pregnant or may be pregnant.  Any problems you or family members have had with anesthetic medicines. What are the risks? Generally, this is a safe procedure. However, problems may occur, including:  Infection.  Bleeding of the kidney.  Bruising of the kidney or skin.  Scarring of the kidney, which can lead to: ? Increased blood pressure. ? Poor kidney function. ? Return (recurrence) of kidney stones.  Damage to other structures or organs, such as the liver, colon, spleen, or pancreas.  Blockage (obstruction) of the the tube that carries urine from the kidney to the bladder (ureter).  Failure of the kidney stone to break into pieces (fragments). What happens before the procedure? Staying hydrated Follow instructions from your health care provider about hydration, which may include:  Up to 2 hours before the procedure - you may continue to drink clear liquids, such as water, clear fruit juice, black coffee, and plain tea. Eating and drinking restrictions Follow instructions from your health care provider about eating and drinking, which may  include:  8 hours before the procedure - stop eating heavy meals or foods such as meat, fried foods, or fatty foods.  6 hours before the procedure - stop eating light meals or foods, such as toast or cereal.  6 hours before the procedure - stop drinking milk or drinks that contain milk.  2 hours before the procedure - stop drinking clear liquids. General instructions  Plan to have someone take you home from the hospital or clinic.  Ask your health care provider about: ? Changing or stopping your regular medicines. This is especially important if you are taking diabetes medicines or blood thinners. ? Taking medicines such as aspirin and ibuprofen. These medicines and other NSAIDs can thin your blood. Do not take these medicines for 7 days before your procedure if your health care provider instructs you not to.  You may have tests, such as: ? Blood tests. ? Urine tests. ? Imaging tests, such as a CT scan. What happens during the procedure?  To lower your risk of infection: ? Your health care team will wash or sanitize their hands. ? Your skin will be washed with soap.  An IV tube will be inserted into one of your veins. This tube will give you fluids and medicines.  You will be given one or more of the following: ? A medicine to help you relax (sedative). ? A medicine to make you fall asleep (general anesthetic).  A water-filled cushion may be placed behind your kidney or on your abdomen. In some cases you may be placed in a tub of lukewarm water.  Your body will  be positioned in a way that makes it easy to target the kidney stone.  A flexible tube with holes in it (stent) may be placed in the ureter. This will help keep urine flowing from the kidney if the fragments of the stone have been blocking the ureter.  An X-ray or ultrasound exam will be done to locate your stone.  Shock waves will be aimed at the stone. If you are awake, you may feel a tapping sensation as the shock  waves pass through your body. The procedure may vary among health care providers and hospitals. What happens after the procedure?  You may have an X-ray to see whether the procedure was able to break up the kidney stone and how much of the stone has passed. If large stone fragments remain after treatment, you may need to have a second procedure at a later time.  Your blood pressure, heart rate, breathing rate, and blood oxygen level will be monitored until the medicines you were given have worn off.  You may be given antibiotics or pain medicine as needed.  If a stent was placed in your ureter during surgery, it may stay in place for a few weeks.  You may need strain your urine to collect pieces of the kidney stone for testing.  You will need to drink plenty of water.  Do not drive for 24 hours if you were given a sedative. Summary  Lithotripsy is a treatment that can sometimes help eliminate kidney stones and the pain that they cause.  A form of lithotripsy, also known as extracorporeal shock wave lithotripsy, is a nonsurgical procedure that crushes a kidney stone with shock waves.  Generally, this is a safe procedure. However, problems may occur, including damage to the kidney or other organs, infection, or obstruction of the tube that carries urine from the kidney to the bladder (ureter).  When you go home, you will need to drink plenty of water. You may be asked to strain your urine to collect pieces of the kidney stone for testing. This information is not intended to replace advice given to you by your health care provider. Make sure you discuss any questions you have with your health care provider. Document Revised: 01/15/2019 Document Reviewed: 08/25/2016 Elsevier Patient Education  2020 [place].   Kidney Stones Kidney stones are rock-like masses that form inside of the kidneys. Kidneys are organs that make pee (urine). A kidney stone may move into other parts of the  urinary tract, including:  The tubes that connect the kidneys to the bladder (ureters).  The bladder.  The tube that carries urine out of the body (urethra). Kidney stones can cause very bad pain and can block the flow of pee. The stone usually leaves your body (passes) through your pee. You may need to have a doctor take out the stone. What are the causes? Kidney stones may be caused by:  A condition in which certain glands make too much parathyroid hormone (primary hyperparathyroidism).  A buildup of a type of crystals in the bladder made of a chemical called uric acid. The body makes uric acid when you eat certain foods.  Narrowing (stricture) of one or both of the ureters.  A kidney blockage that you were born with.  Past surgery on the kidney or the ureters, such as gastric bypass surgery. What increases the risk? You are more likely to develop this condition if:  You have had a kidney stone in the past.  You  have a family history of kidney stones.  You do not drink enough water.  You eat a diet that is high in protein, salt (sodium), or sugar.  You are overweight or very overweight (obese). What are the signs or symptoms? Symptoms of a kidney stone may include:  Pain in the side of the belly, right below the ribs (flank pain). Pain usually spreads (radiates) to the groin.  Needing to pee often or right away (urgently).  Pain when going pee (urinating).  Blood in your pee (hematuria).  Feeling like you may vomit (nauseous).  Vomiting.  Fever and chills. How is this treated? Treatment depends on the size, location, and makeup of the kidney stones. The stones will often pass out of the body through peeing. You may need to:  Drink more fluid to help pass the stone. In some cases, you may be given fluids through an IV tube put into one of your veins at the hospital.  Take medicine for pain.  Make changes in your diet to help keep kidney stones from coming back.  Sometimes, medical procedures are needed to remove a kidney stone. This may involve:  A procedure to break up kidney stones using a beam of light (laser) or shock waves.  Surgery to remove the kidney stones. Follow these instructions at home: Medicines  Take over-the-counter and prescription medicines only as told by your doctor.  Ask your doctor if the medicine prescribed to you requires you to avoid driving or using heavy machinery. Eating and drinking  Drink enough fluid to keep your pee pale yellow. You may be told to drink at least 8-10 glasses of water each day. This will help you pass the stone.  If told by your doctor, change your diet. This may include: ? Limiting how much salt you eat. ? Eating more fruits and vegetables. ? Limiting how much meat, poultry, fish, and eggs you eat.  Follow instructions from your doctor about eating or drinking restrictions. General instructions  Collect pee samples as told by your doctor. You may need to collect a pee sample: ? 24 hours after a stone comes out. ? 8-12 weeks after a stone comes out, and every 6-12 months after that.  Strain your pee every time you pee (urinate), for as long as told. Use the strainer that your doctor recommends.  Do not throw out the stone. Keep it so that it can be tested by your doctor.  Keep all follow-up visits as told by your doctor. This is important. You may need follow-up tests. How is this prevented? To prevent another kidney stone:  Drink enough fluid to keep your pee pale yellow. This is the best way to prevent kidney stones.  Eat healthy foods.  Avoid certain foods as told by your doctor. You may be told to eat less protein.  Stay at a healthy weight. Where to find more information  National Kidney Foundation (NKF): www.kidney.org  [facility]): www.urologyhealth.org Contact a doctor if:  You have pain that gets worse or does not get better with medicine. Get help  right away if:  You have a fever or chills.  You get very bad pain.  You get new pain in your belly (abdomen).  You pass out (faint).  You cannot pee. Summary  Kidney stones are rock-like masses that form inside of the kidneys.  Kidney stones can cause very bad pain and can block the flow of pee.  The stones will often pass out  of the body through peeing.  Drink enough fluid to keep your pee pale yellow. This information is not intended to replace advice given to you by your health care provider. Make sure you discuss any questions you have with your health care provider. Document Revised: 02/20/2019 Document Reviewed: 02/20/2019 Elsevier Patient Education  2020 [employer]   Dietary Guidelines to Help Prevent Kidney Stones Kidney stones are deposits of minerals and salts that form inside your kidneys. Your risk of developing kidney stones may be greater depending on your diet, your lifestyle, the medicines you take, and whether you have certain medical conditions. Most people can reduce their chances of developing kidney stones by following the instructions below. Depending on your overall health and the type of kidney stones you tend to develop, your dietitian may give you more specific instructions. What are tips for following this plan? Reading food labels  Choose foods with "no salt added" or "low-salt" labels. Limit your sodium intake to less than 1500 mg per day.  Choose foods with calcium for each meal and snack. Try to eat about 300 mg of calcium at each meal. Foods that contain 200-500 mg of calcium per serving include: ? 8 oz (237 ml) of milk, fortified nondairy milk, and fortified fruit juice. ? 8 oz (237 ml) of kefir, yogurt, and soy yogurt. ? 4 oz (118 ml) of tofu. ? 1 oz of cheese. ? 1 cup (300 g) of dried figs. ? 1 cup (91 g) of cooked broccoli. ? 1-3 oz can of sardines or mackerel.  Most people need 1000 to 1500 mg of calcium each day. Talk to your  dietitian about how much calcium is recommended for you. Shopping  Buy plenty of fresh fruits and vegetables. Most people do not need to avoid fruits and vegetables, even if they contain nutrients that may contribute to kidney stones.  When shopping for convenience foods, choose: ? Whole pieces of fruit. ? Premade salads with dressing on the side. ? Low-fat fruit and yogurt smoothies.  Avoid buying frozen meals or prepared deli foods.  Look for foods with live cultures, such as yogurt and kefir. Cooking  Do not add salt to food when cooking. Place a salt shaker on the table and allow each person to add his or her own salt to taste.  Use vegetable protein, such as beans, textured vegetable protein (TVP), or tofu instead of meat in pasta, casseroles, and soups. Meal planning   Eat less salt, if told by your dietitian. To do this: ? Avoid eating processed or premade food. ? Avoid eating fast food.  Eat less animal protein, including cheese, meat, poultry, or fish, if told by your dietitian. To do this: ? Limit the number of times you have meat, poultry, fish, or cheese each week. Eat a diet free of meat at least 2 days a week. ? Eat only one serving each day of meat, poultry, fish, or seafood. ? When you prepare animal protein, cut pieces into small portion sizes. For most meat and fish, one serving is about the size of one deck of cards.  Eat at least 5 servings of fresh fruits and vegetables each day. To do this: ? Keep fruits and vegetables on hand for snacks. ? Eat 1 piece of fruit or a handful of berries with breakfast. ? Have a salad and fruit at lunch. ? Have two kinds of vegetables at dinner.  Limit foods that are high in a substance called oxalate. These include: ?  Spinach. ? Rhubarb. ? Beets. ? Potato chips and french fries. ? Nuts.  If you regularly take a diuretic medicine, make sure to eat at least 1-2 fruits or vegetables high in potassium each day. These  include: ? Avocado. ? Banana. ? Orange, prune, carrot, or tomato juice. ? Baked potato. ? Cabbage. ? Beans and split peas. General instructions   Drink enough fluid to keep your urine clear or pale yellow. This is the most important thing you can do.  Talk to your health care provider and dietitian about taking daily supplements. Depending on your health and the cause of your kidney stones, you may be advised: ? Not to take supplements with vitamin C. ? To take a calcium supplement. ? To take a daily probiotic supplement. ? To take other supplements such as magnesium, fish oil, or vitamin B6.  Take all medicines and supplements as told by your health care provider.  Limit alcohol intake to no more than 1 drink a day for nonpregnant women and 2 drinks a day for men. One drink equals 12 oz of beer, 5 oz of wine, or 1 oz of hard liquor.  Lose weight if told by your health care provider. Work with your dietitian to find strategies and an eating plan that works best for you. What foods are not recommended? Limit your intake of the following foods, or as told by your dietitian. Talk to your dietitian about specific foods you should avoid based on the type of kidney stones and your overall health. Grains Breads. Bagels. Rolls. Baked goods. Salted crackers. Cereal. Pasta. Vegetables Spinach. Rhubarb. Beets. Canned vegetables. Rosita Fire. Olives. Meats and other protein foods Nuts. Nut butters. Large portions of meat, poultry, or fish. Salted or cured meats. Deli meats. Hot dogs. Sausages. Dairy Cheese. Beverages Regular soft drinks. Regular vegetable juice. Seasonings and other foods Seasoning blends with salt. Salad dressings. Canned soups. Soy sauce. Ketchup. Barbecue sauce. Canned pasta sauce. Casseroles. Pizza. Lasagna. Frozen meals. Potato chips. [place] fries. Summary  You can reduce your risk of kidney stones by making changes to your diet.  The most important thing you can do is  drink enough fluid. You should drink enough fluid to keep your urine clear or pale yellow.  Ask your health care provider or dietitian how much protein from animal sources you should eat each day, and also how much salt and calcium you should have each day. This information is not intended to replace advice given to you by your health care provider. Make sure you discuss any questions you have with your health care provider. Document Revised: 01/24/2019 Document Reviewed: 09/14/2016 Elsevier Patient Education  2020 [employer].

## 2020-01-30 ENCOUNTER — Other Ambulatory Visit: Payer: Self-pay | Admitting: Radiology

## 2020-01-30 DIAGNOSIS — N201 Calculus of ureter: Secondary | ICD-10-CM

## 2020-01-30 LAB — SARS CORONAVIRUS 2 (TAT 6-24 HRS): SARS Coronavirus 2: NEGATIVE

## 2020-01-31 ENCOUNTER — Encounter
Admission: RE | Disposition: A | Discharge status: Home / Self Care | Payer: Self-pay | Source: Home / Self Care | Attending: Urology

## 2020-01-31 ENCOUNTER — Ambulatory Visit: Payer: 59

## 2020-01-31 ENCOUNTER — Ambulatory Visit
Admission: RE | Admit: 2020-01-31 | Discharge: 2020-01-31 | Disposition: A | Discharge status: Home / Self Care | Payer: 59 | Attending: Urology | Admitting: Urology

## 2020-01-31 ENCOUNTER — Encounter: Payer: Self-pay | Admitting: Urology

## 2020-01-31 ENCOUNTER — Other Ambulatory Visit: Payer: Self-pay

## 2020-01-31 DIAGNOSIS — N201 Calculus of ureter: Secondary | ICD-10-CM | POA: Diagnosis present

## 2020-01-31 HISTORY — PX: EXTRACORPOREAL SHOCK WAVE LITHOTRIPSY: SHX1557

## 2020-01-31 SURGERY — LITHOTRIPSY, ESWL
Anesthesia: Moderate Sedation | Laterality: Right

## 2020-01-31 MED ORDER — DIPHENHYDRAMINE HCL 25 MG PO CAPS: 25.0000 mg | ORAL_CAPSULE | ORAL | Status: AC

## 2020-01-31 MED ORDER — CIPROFLOXACIN HCL 500 MG PO TABS
ORAL_TABLET | ORAL | Status: AC
  Administered 2020-01-31: 500 mg via ORAL
  Filled 2020-01-31: qty 1

## 2020-01-31 MED ORDER — ONDANSETRON HCL 4 MG/2ML IJ SOLN
INTRAMUSCULAR | Status: AC
  Administered 2020-01-31: 4 mg via INTRAVENOUS
  Filled 2020-01-31: qty 2

## 2020-01-31 MED ORDER — DIAZEPAM 5 MG PO TABS
ORAL_TABLET | ORAL | Status: AC
  Administered 2020-01-31: 10 mg via ORAL
  Filled 2020-01-31: qty 2

## 2020-01-31 MED ORDER — OXYCODONE-ACETAMINOPHEN 5-325 MG PO TABS: 1.0000 | ORAL_TABLET | ORAL | 0 refills | Status: AC | PRN

## 2020-01-31 MED ORDER — DIPHENHYDRAMINE HCL 25 MG PO CAPS
ORAL_CAPSULE | ORAL | Status: AC
  Administered 2020-01-31: 25 mg via ORAL
  Filled 2020-01-31: qty 1

## 2020-01-31 MED ORDER — CIPROFLOXACIN HCL 500 MG PO TABS: 500.0000 mg | ORAL_TABLET | ORAL | Status: AC

## 2020-01-31 MED ORDER — TAMSULOSIN HCL 0.4 MG PO CAPS: 0.4000 mg | ORAL_CAPSULE | Freq: Every day | ORAL | 1 refills | Status: AC

## 2020-01-31 MED ORDER — DIAZEPAM 5 MG PO TABS: 10.0000 mg | ORAL_TABLET | ORAL | Status: AC

## 2020-01-31 MED ORDER — SODIUM CHLORIDE 0.9 % IV SOLN: INTRAVENOUS | Status: DC

## 2020-01-31 MED ORDER — DOCUSATE SODIUM 100 MG PO CAPS: 100.0000 mg | ORAL_CAPSULE | Freq: Two times a day (BID) | ORAL | 0 refills | Status: AC

## 2020-01-31 MED ORDER — OXYCODONE-ACETAMINOPHEN 5-325 MG PO TABS
1.0000 | ORAL_TABLET | Freq: Four times a day (QID) | ORAL | Status: DC | PRN
  Administered 2020-01-31: 1 via ORAL

## 2020-01-31 MED ORDER — ONDANSETRON HCL 4 MG/2ML IJ SOLN: 4.0000 mg | Freq: Once | INTRAMUSCULAR | Status: AC | PRN

## 2020-01-31 NOTE — Brief Op Note (Signed)
01/31/2020  11:20 AM  PATIENT:  Douglas Frank  29 y.o. male  PRE-OPERATIVE DIAGNOSIS:  Right 66mm proximal ureteral stone  POST-OPERATIVE DIAGNOSIS:  Same  PROCEDURE:  Procedure(s): EXTRACORPOREAL SHOCK WAVE LITHOTRIPSY (ESWL) (Right)  SURGEON:  Surgeon(s) and Role:    * Sondra Come, MD - Primary  ANESTHESIA: Conscious Sedation  EBL:  None  Drains: None  Specimen: None  Findings:  1. Good fragmentation of stone on KUB  DISPO: Flomax, pain meds PRN, RTC 2 weeks KUB  Legrand Rams, MD 01/31/2020

## 2020-02-14 ENCOUNTER — Ambulatory Visit (INDEPENDENT_AMBULATORY_CARE_PROVIDER_SITE_OTHER): Payer: 59 | Admitting: Physician Assistant

## 2020-02-14 ENCOUNTER — Ambulatory Visit
Admission: RE | Admit: 2020-02-14 | Discharge: 2020-02-14 | Disposition: A | Discharge status: Home / Self Care | Payer: 59 | Source: Ambulatory Visit | Attending: Urology | Admitting: Urology

## 2020-02-14 ENCOUNTER — Encounter: Payer: Self-pay | Admitting: Physician Assistant

## 2020-02-14 ENCOUNTER — Ambulatory Visit
Admission: RE | Admit: 2020-02-14 | Discharge: 2020-02-14 | Disposition: A | Discharge status: Home / Self Care | Payer: 59 | Attending: Urology | Admitting: Urology

## 2020-02-14 ENCOUNTER — Other Ambulatory Visit: Payer: Self-pay

## 2020-02-14 VITALS — BP 125/79 | HR 89 | Ht 69.0 in | Wt 185.0 lb

## 2020-02-14 DIAGNOSIS — N201 Calculus of ureter: Secondary | ICD-10-CM

## 2020-02-14 NOTE — Progress Notes (Signed)
02/14/2020 10:03 AM   Douglas Frank 02-Jun-1991 [id number]  CC: Postop ESWL  HPI: Douglas Frank is a 29 y.o. male who presents for postoperative evaluation s/p ESWL with Dr. Richardo Hanks on 01/31/2020 for management of a 4 mm proximal right ureteral stone.  Intraoperative findings notable for good fragmentation of stone on KUB.  Today, patient reports having passed multiple stone fragments the day after treatment.  He brings fragments with him to clinic today.  He denies flank pain, nausea, vomiting, and gross hematuria.  No acute concerns.  Follow-up KUB today revealed resolution of the proximal right ureteral stone.  Patient denies a history of nephrolithiasis.  PMH: Past Medical History:  Diagnosis Date  . Kidney stone     Surgical History: Past Surgical History:  Procedure Laterality Date  . COLONOSCOPY  2011  . EXTRACORPOREAL SHOCK WAVE LITHOTRIPSY Right 01/31/2020   Procedure: EXTRACORPOREAL SHOCK WAVE LITHOTRIPSY (ESWL);  Surgeon: Sondra Come, MD;  Location: [facility] ORS;  Service: Urology;  Laterality: Right;    Home Medications:  Allergies as of 02/14/2020      Reactions   Other    Cat, Grass and some dogs patient reports      Medication List       Accurate as of February 14, 2020 10:03 AM. If you have any questions, ask your nurse or doctor.        meloxicam 15 MG tablet Commonly known as: MOBIC Take 1 tablet (15 mg total) by mouth daily.   ondansetron 4 MG disintegrating tablet Commonly known as: ZOFRAN-ODT Take 1 tablet (4 mg total) by mouth every 8 (eight) hours as needed for nausea or vomiting.   oxyCODONE-acetaminophen 5-325 MG tablet Commonly known as: PERCOCET/ROXICET Take 1 tablet by mouth every 6 (six) hours as needed for severe pain.   tamsulosin 0.4 MG Caps capsule Commonly known as: FLOMAX Take 1 capsule (0.4 mg total) by mouth daily.       Allergies:  Allergies  Allergen Reactions  . Other     Cat, Grass and some dogs  patient reports    Family History: Family History  Problem Relation Age of Onset  . Hypertension Mother   . Cancer Maternal Aunt   . Cancer Maternal Uncle   . Prostate cancer Neg Hx   . Kidney cancer Neg Hx   . Kidney failure Neg Hx   . Sickle cell anemia Neg Hx   . Tuberculosis Neg Hx     Social History:  reports that he has never smoked. He has never used smokeless tobacco. He reports current alcohol use. He reports that he does not use drugs.  Physical Exam: There were no vitals taken for this visit.  Constitutional:  Alert and oriented, No acute distress. HEENT: [place] AT, moist mucus membranes.  Trachea midline, no masses. Cardiovascular: No clubbing, cyanosis, or edema. Respiratory: Normal respiratory effort, no increased work of breathing. Skin: No rashes, bruises or suspicious lesions. Neurologic: Grossly intact, no focal deficits, moving all 4 extremities. Psychiatric: Normal mood and affect.  Pertinent Imaging: KUB, 02/14/2020: CLINICAL DATA:  Recent right-sided ureteral calculus  EXAM: ABDOMEN - 1 VIEW  COMPARISON:  January 31, 2020  FINDINGS: The previously noted calcification at the L2-3 level on the right is no longer evident. There are stable appearing calcifications in the pelvis, presumed phleboliths. There is an apparent 1 mm calculus in the lower pole the right kidney.  There is moderate stool in the colon. No bowel dilatation or air-fluid level  to suggest bowel obstruction. No evident free air. Lung bases clear.  IMPRESSION: Previously noted apparent right ureteral calculus no longer appreciable. 1 mm apparent calculus lower pole right kidney. Phleboliths in pelvis. No bowel obstruction or free air. Moderate stool in colon.   Electronically Signed   By: Lowella Grip III M.D.   On: 02/14/2020 15:12  I personally reviewed the images referenced above and note resolution of the right ureteral calculus.  Assessment & Plan:   1. Right  ureteral stone 29 year old male s/p ESWL for management of a 4 mm proximal right ureteral stone.  Will send stone fragments for analysis today and contact patient with results.  I counseled the patient on general stone prevention techniques including increasing water intake with a goal of producing 2.5L of urine daily, increased citric acid intake, avoidance of high oxalate-containing foods, and decreasing salt intake.  Written and verbal information about dietary recommendations provided today.   Patient prefers to follow-up with [place] on an as-needed basis.  I'm in agreement with this plan.  Return if symptoms worsen or fail to improve, will call with stone analysis results.  Debroah Loop, PA-C  [facility] [address] [place],  [address] [phone]

## 2020-02-18 ENCOUNTER — Other Ambulatory Visit: Payer: Self-pay | Admitting: Physician Assistant

## 2020-02-23 ENCOUNTER — Ambulatory Visit: Payer: 59

## 2020-02-23 ENCOUNTER — Other Ambulatory Visit: Payer: Self-pay | Admitting: Urology

## 2020-04-09 ENCOUNTER — Ambulatory Visit: Payer: Self-pay | Admitting: Adult Health

## 2021-06-19 ENCOUNTER — Other Ambulatory Visit: Payer: Self-pay | Admitting: Internal Medicine

## 2021-06-19 DIAGNOSIS — R1084 Generalized abdominal pain: Secondary | ICD-10-CM

## 2021-06-23 ENCOUNTER — Ambulatory Visit: Admission: RE | Admit: 2021-06-23 | Payer: No Typology Code available for payment source | Source: Ambulatory Visit

## 2021-06-23 ENCOUNTER — Other Ambulatory Visit: Payer: Self-pay

## 2021-07-02 ENCOUNTER — Ambulatory Visit
Admission: RE | Admit: 2021-07-02 | Discharge: 2021-07-02 | Disposition: A | Discharge status: Home / Self Care | Payer: No Typology Code available for payment source | Source: Ambulatory Visit | Attending: Internal Medicine | Admitting: Internal Medicine

## 2021-07-02 ENCOUNTER — Other Ambulatory Visit: Payer: Self-pay

## 2021-07-02 DIAGNOSIS — R1084 Generalized abdominal pain: Secondary | ICD-10-CM | POA: Insufficient documentation

## 2021-12-25 IMAGING — CT CT ABD-PELV W/ CM
2 of 4 series · 16 of 46 positions shown, 18 images · IV contrast (APPLIED)
Comparison: None.

CLINICAL DATA: Right lower quadrant pain

EXAM:
CT ABDOMEN AND PELVIS WITH CONTRAST
TECHNIQUE: Multidetector CT imaging of the abdomen and pelvis was performed
using the standard protocol following bolus administration of
intravenous contrast.
CONTRAST:  100mL OMNIPAQUE IOHEXOL 300 MG/ML  SOLN

[Series 2: routine abd/pel with · axial · 0.79mm/px · z∈[-1185,-690]mm · 13 of 109 slices shown, 15 images]
[im 5/109  soft-tissue]
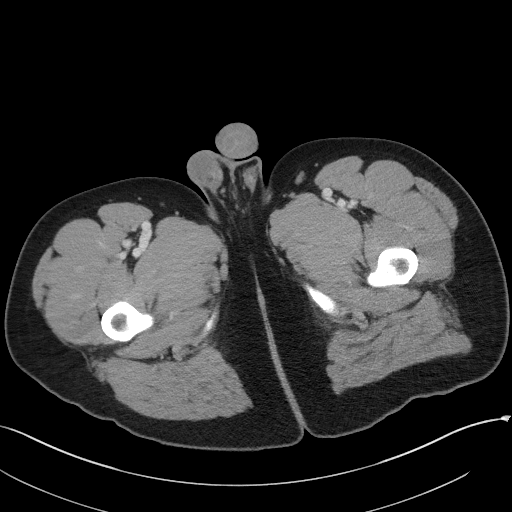
[im 5/109  bone]
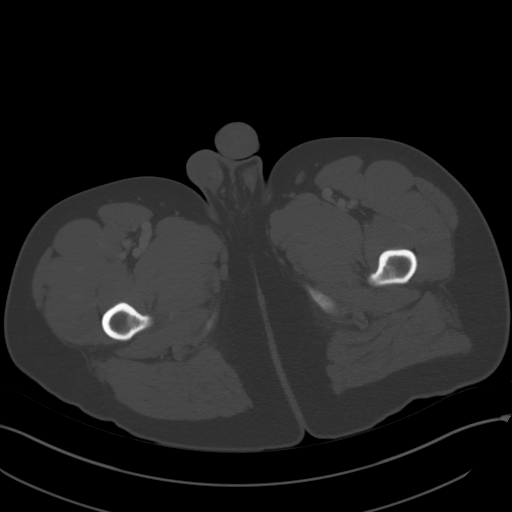
[im 14/109  soft-tissue]
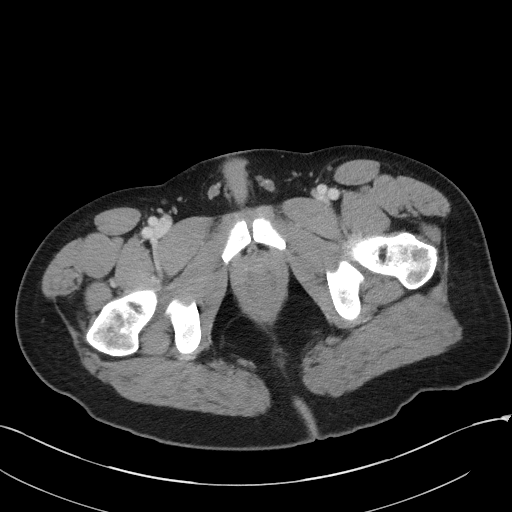
[im 23/109  soft-tissue]
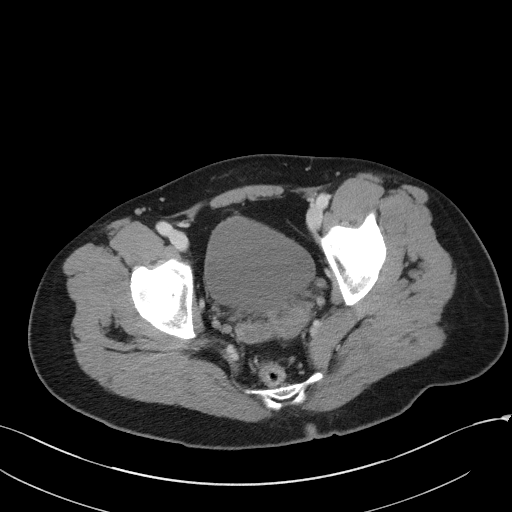
[im 32/109  soft-tissue]
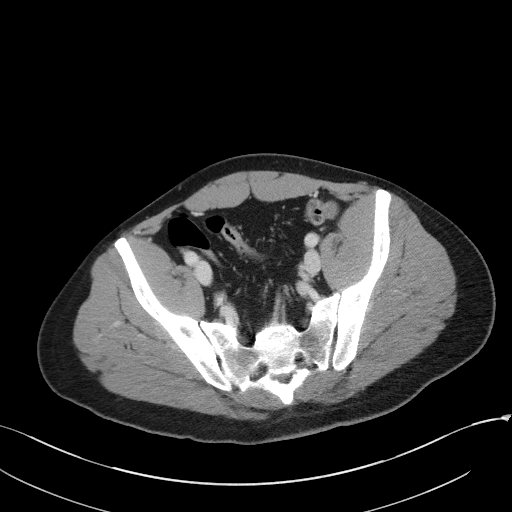
[im 37/109  soft-tissue]
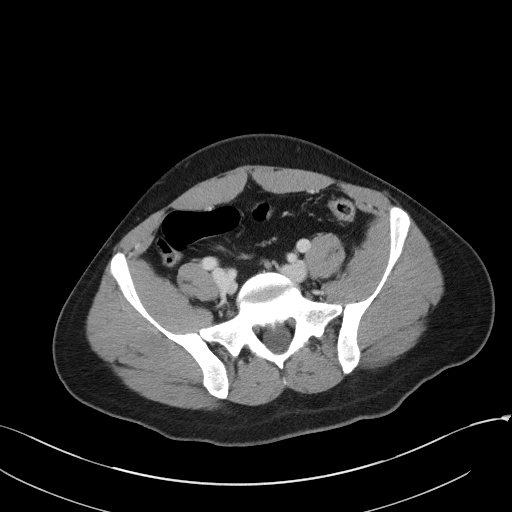
[im 46/109  soft-tissue]
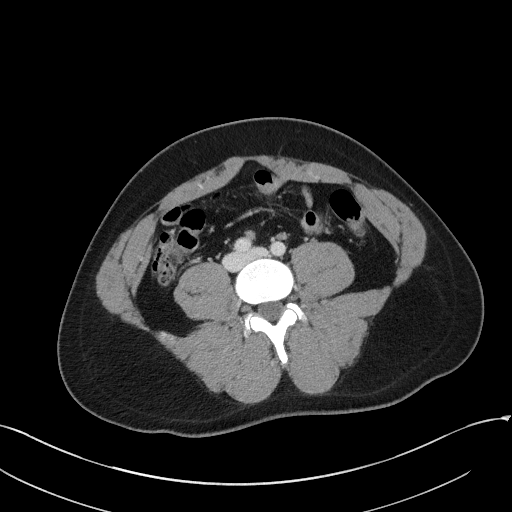
[im 55/109  soft-tissue]
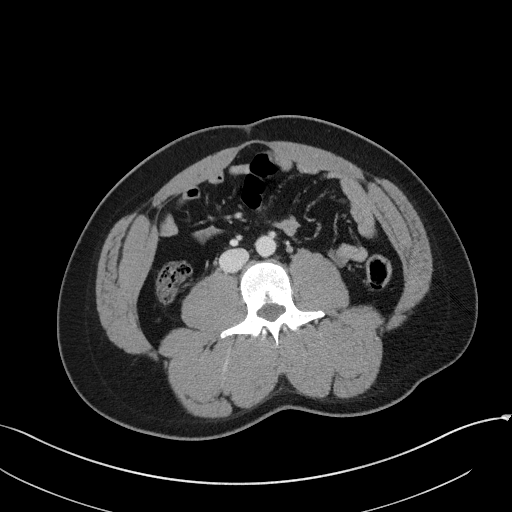
[im 64/109  soft-tissue]
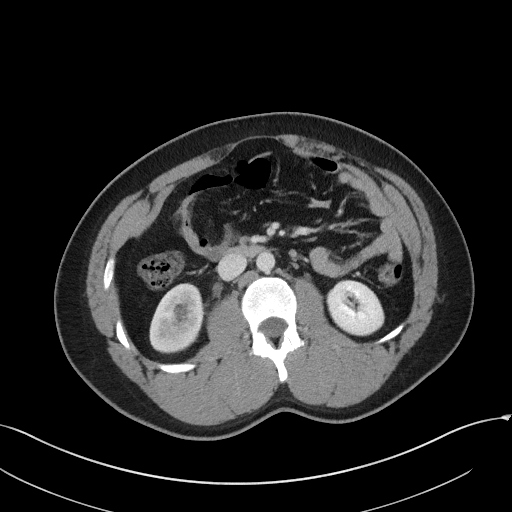
[im 73/109  soft-tissue]
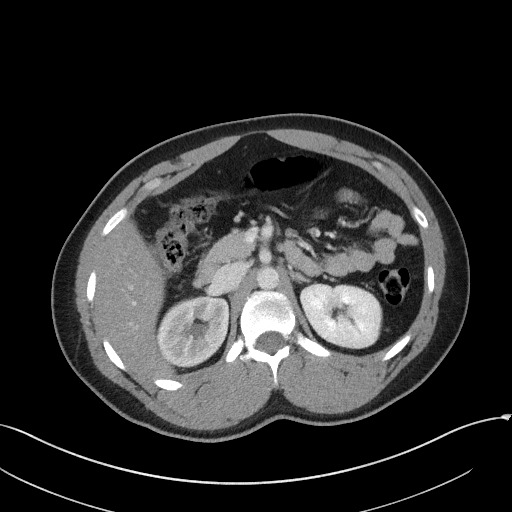
[im 73/109  bone]
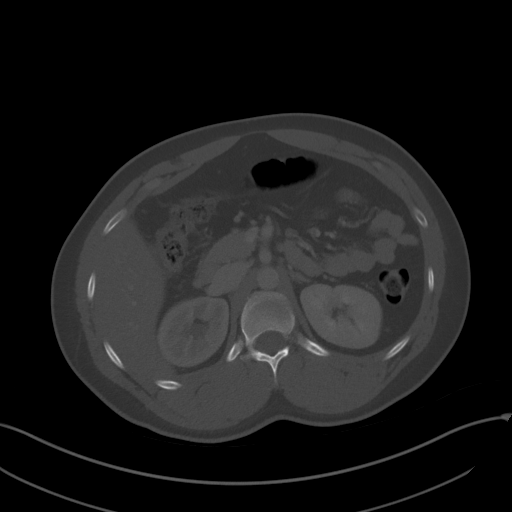
[im 77/109  soft-tissue]
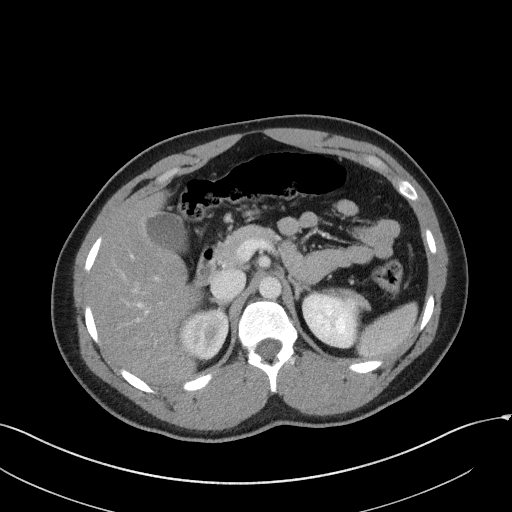
[im 86/109  soft-tissue]
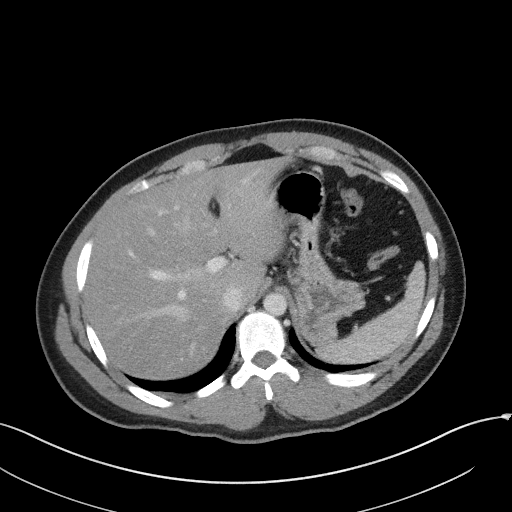
[im 95/109  soft-tissue]
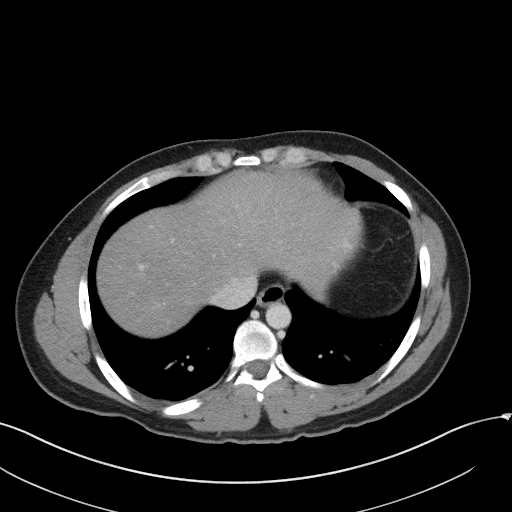
[im 104/109  soft-tissue]
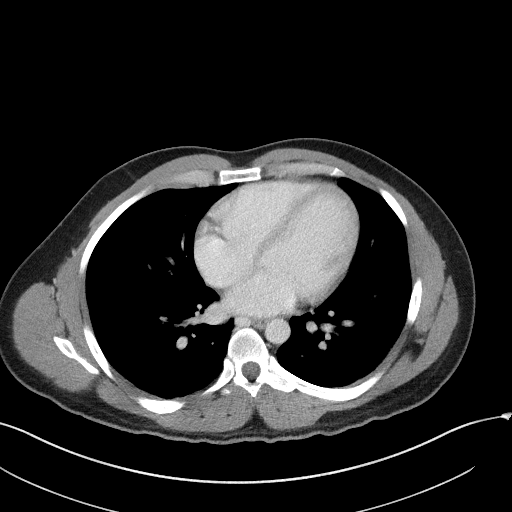

[Series 5: cor st · coronal · 0.72mm/px · 3 of 77 slices shown]
[im 26/77  soft-tissue]
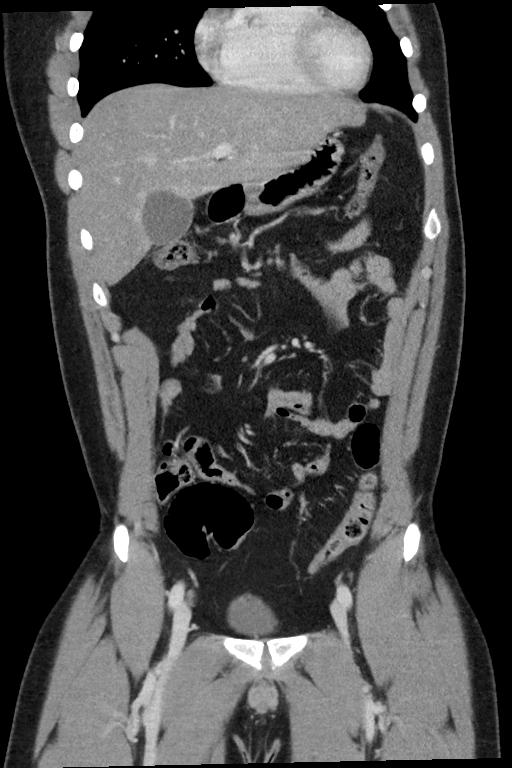
[im 34/77  soft-tissue]
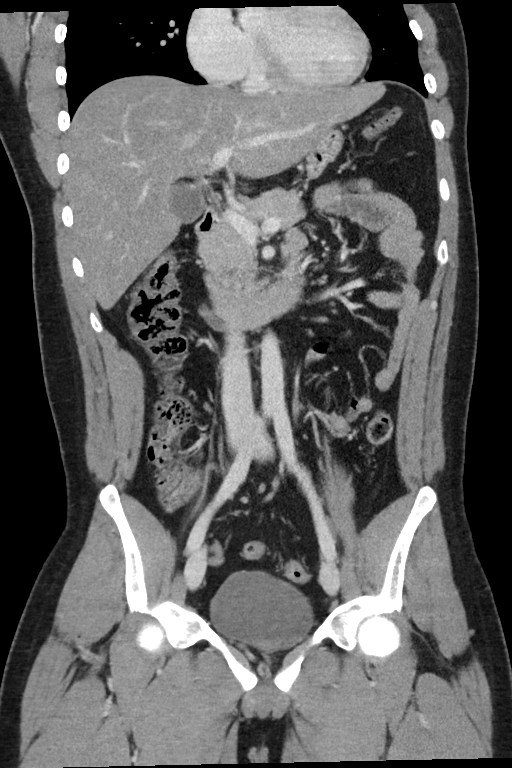
[im 43/77  soft-tissue]
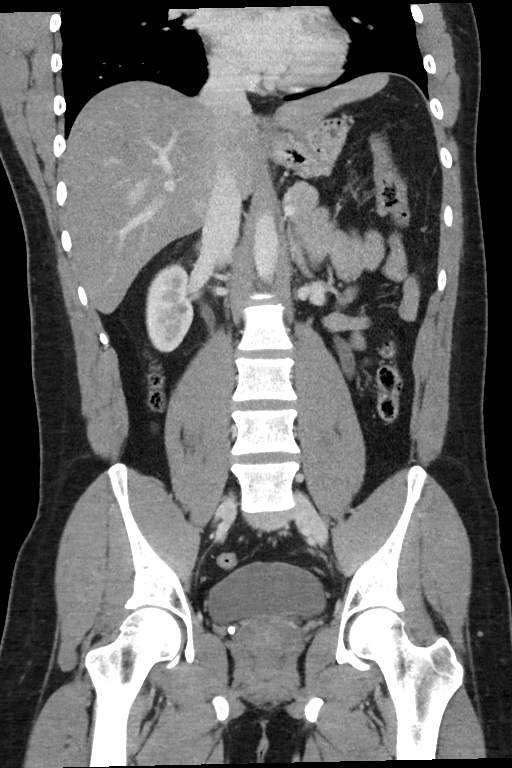

[16 of 46 positions shown; findings below may reference images not displayed]

FINDINGS: Lower chest: No acute abnormality.

Hepatobiliary: Hepatic steatosis with fat sparing near the
gallbladder fossa. No calcified stone or biliary dilatation.

Pancreas: Unremarkable. No pancreatic ductal dilatation or
surrounding inflammatory changes.

Spleen: Normal in size without focal abnormality.

Adrenals/Urinary Tract: Adrenal glands are normal. Mild right
hydronephrosis and proximal hydroureter, secondary to a 2-3 mm stone
in the proximal right ureter at the L3 level. Bladder is normal

Stomach/Bowel: Stomach is within normal limits. Appendix appears
normal. No evidence of bowel wall thickening, distention, or
inflammatory changes.

Vascular/Lymphatic: No significant vascular findings are present. No
enlarged abdominal or pelvic lymph nodes.

Reproductive: Prostate is unremarkable.

Other: Negative for free air or free fluid

Musculoskeletal: No acute or significant osseous findings.
IMPRESSION: 1. Mild right hydronephrosis and hydroureter, secondary to a 2-3 mm
stone in the proximal right ureter at the L3 level.
2. Negative for acute appendicitis
3. Hepatic steatosis

## 2022-01-11 IMAGING — CR DG ABDOMEN 1V
2 series · 2 of 2 positions shown · non-contrast
Comparison: January 31, 2020

CLINICAL DATA: Recent right-sided ureteral calculus

EXAM:
ABDOMEN - 1 VIEW

[abdomen kub (1 of 2)]
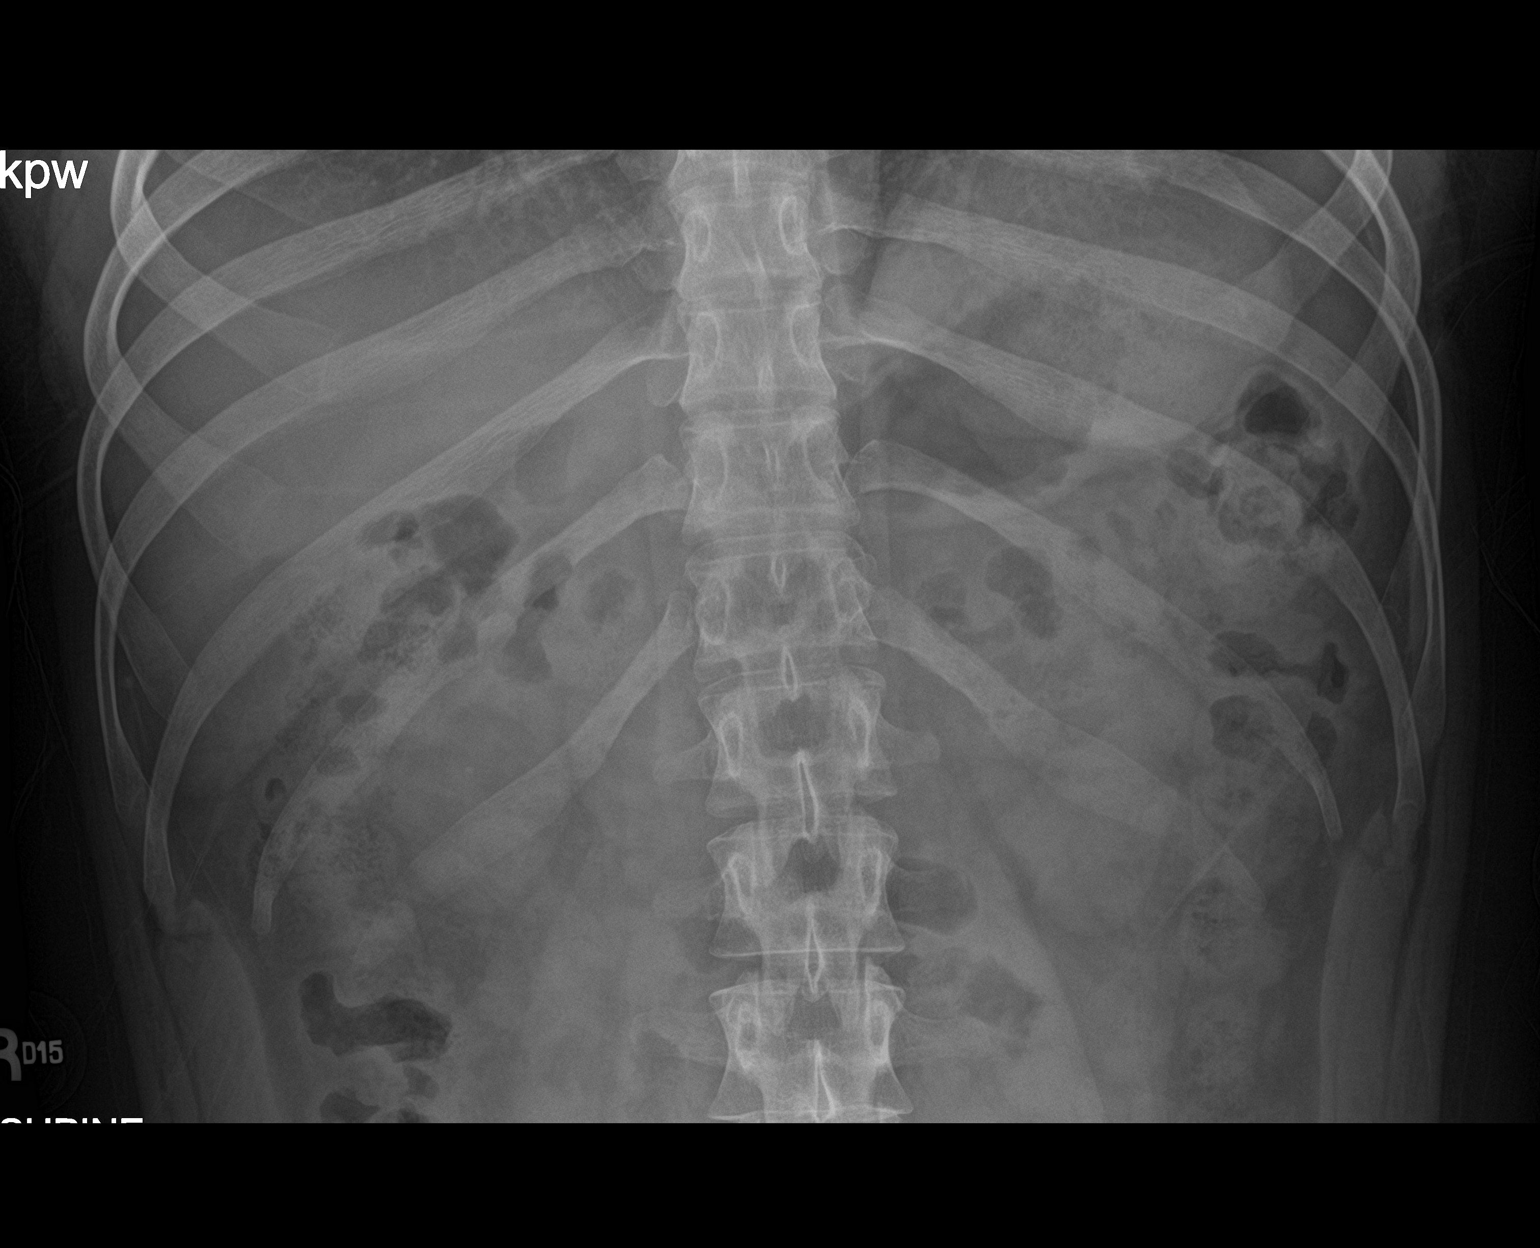

[abdomen kub (2 of 2)]
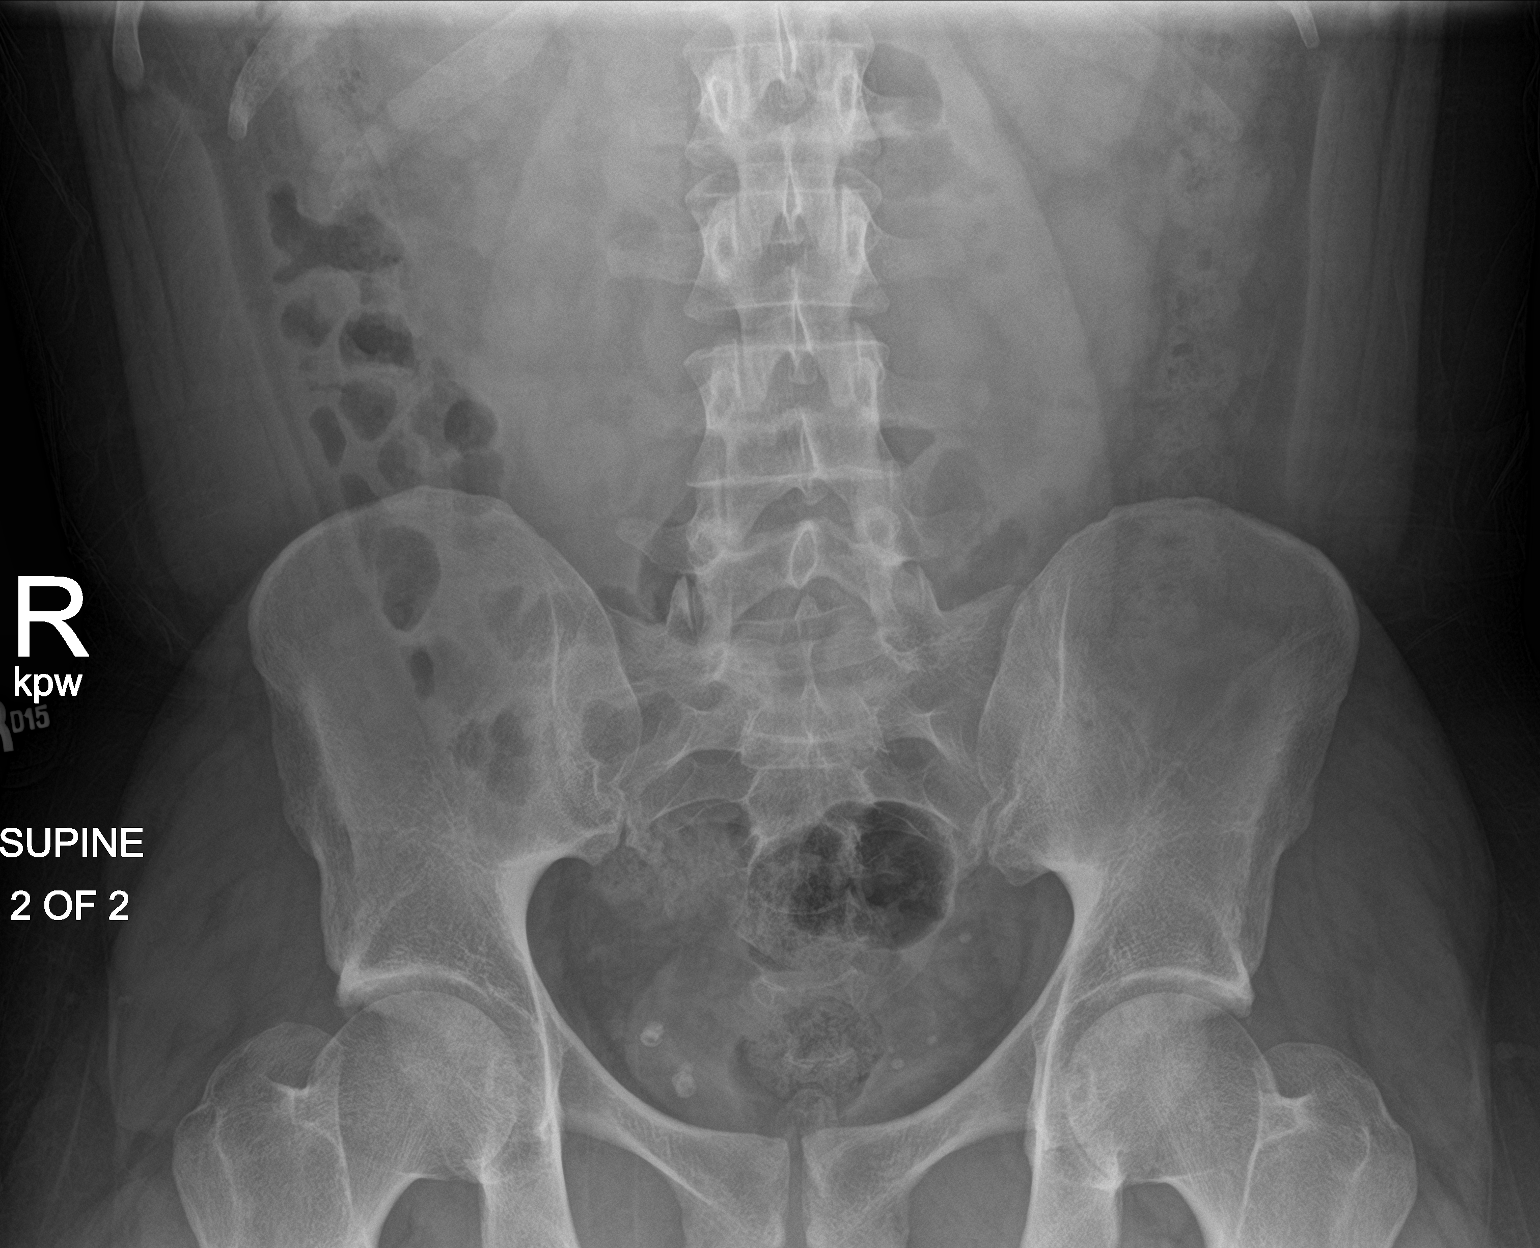

[2 of 2 positions shown; findings below may reference images not displayed]

FINDINGS: The previously noted calcification at the L2-3 level on the right is
no longer evident. There are stable appearing calcifications in the
pelvis, presumed phleboliths. There is an apparent 1 mm calculus in
the lower pole the right kidney.

There is moderate stool in the colon. No bowel dilatation or
air-fluid level to suggest bowel obstruction. No evident free air.
Lung bases clear.
IMPRESSION: Previously noted apparent right ureteral calculus no longer
appreciable. 1 mm apparent calculus lower pole right kidney.
Phleboliths in pelvis. No bowel obstruction or free air. Moderate
stool in colon.

## 2023-05-30 IMAGING — US US ABDOMEN COMPLETE
1 series · 14 of 25 positions shown · non-contrast
Comparison: Radiograph 02/14/2020, CT 01/28/2020

CLINICAL DATA: Right-sided abdominal pain

EXAM:
ABDOMEN ULTRASOUND COMPLETE

[Series 1: us abdomen complete · 14 of 108 slices shown]
[im 1/108]
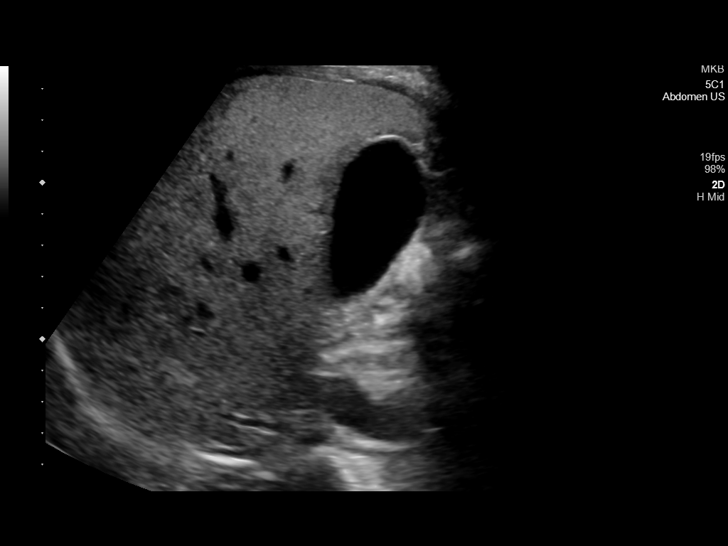
[im 9/108]
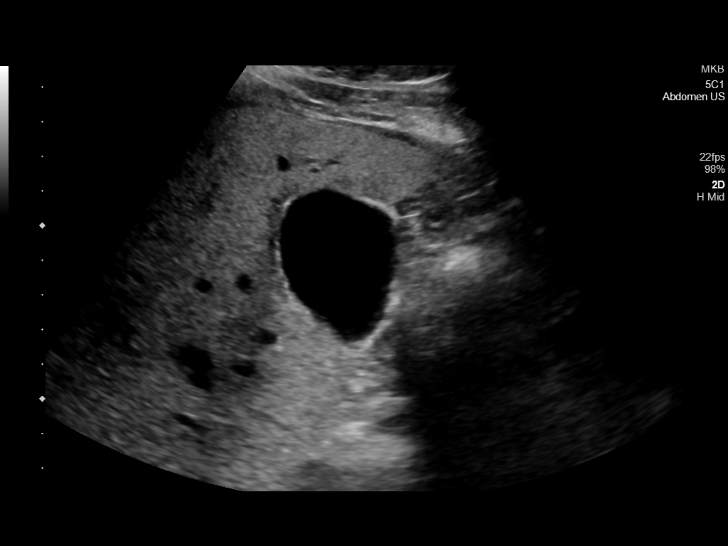
[im 18/108]
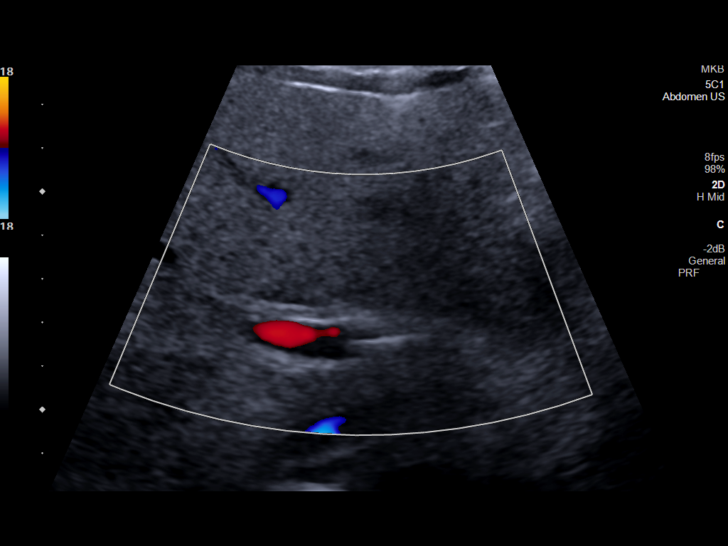
[im 27/108]
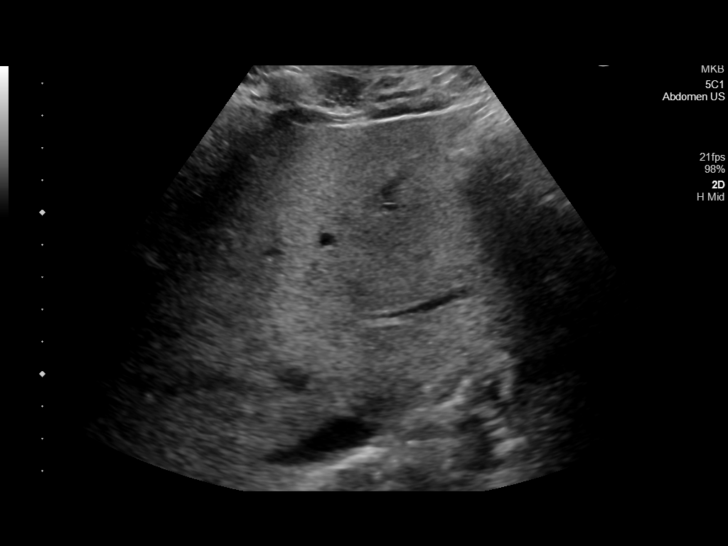
[im 36/108]
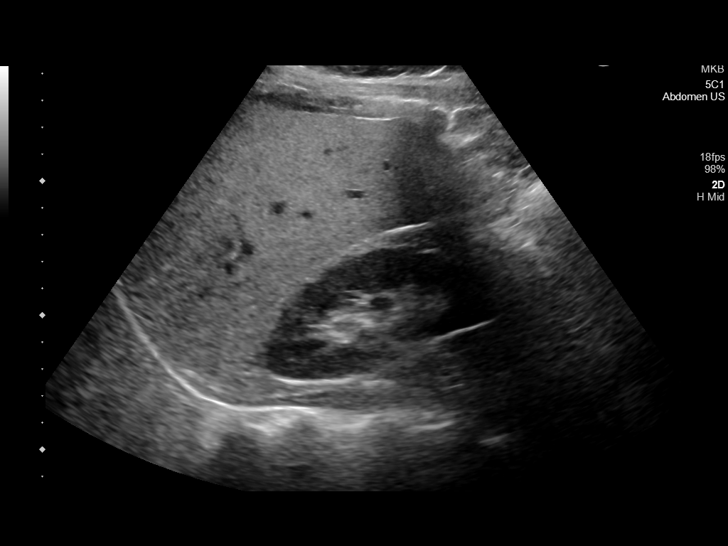
[im 41/108]
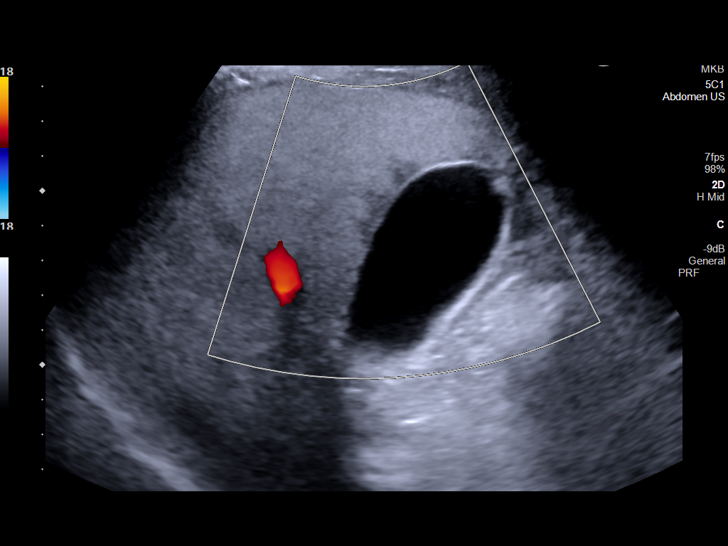
[im 50/108]
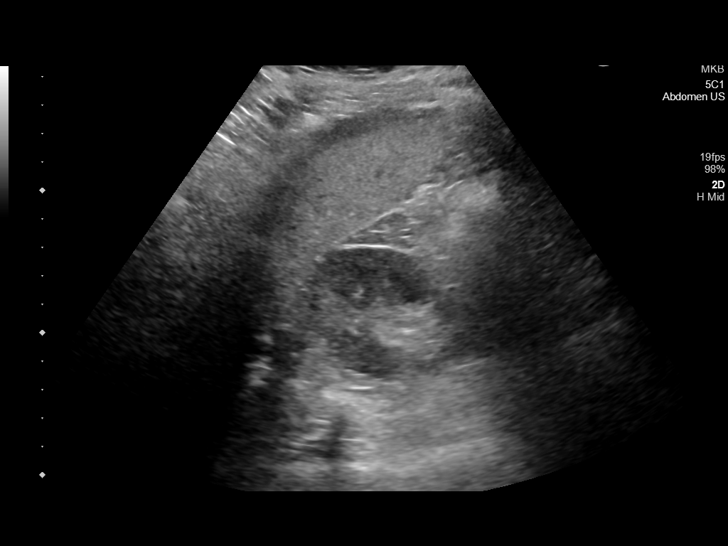
[im 58/108]
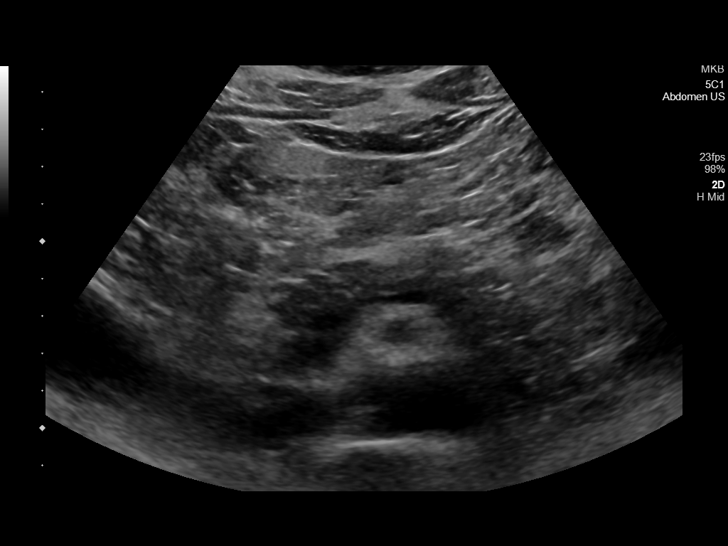
[im 67/108]
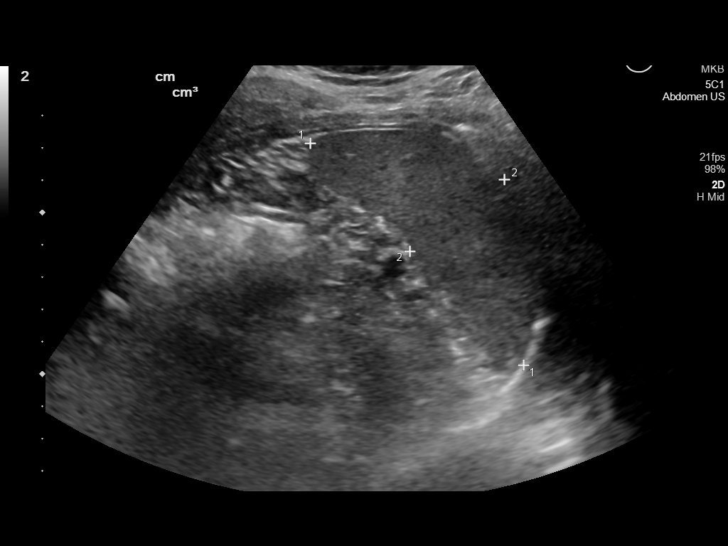
[im 72/108]
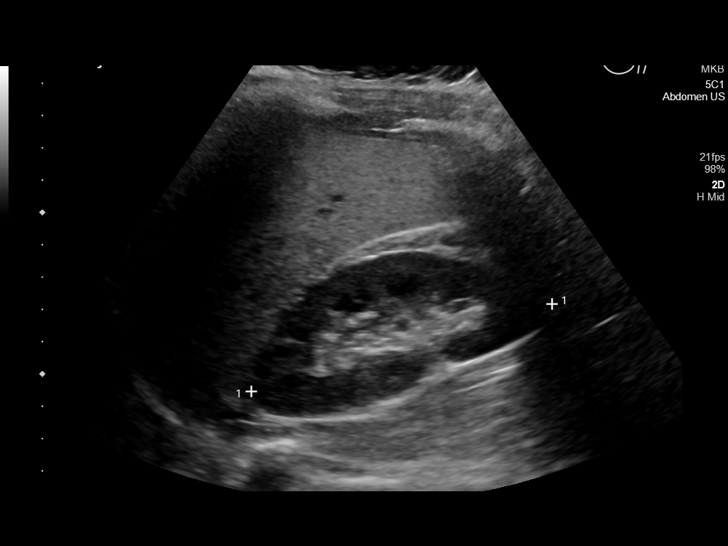
[im 81/108]
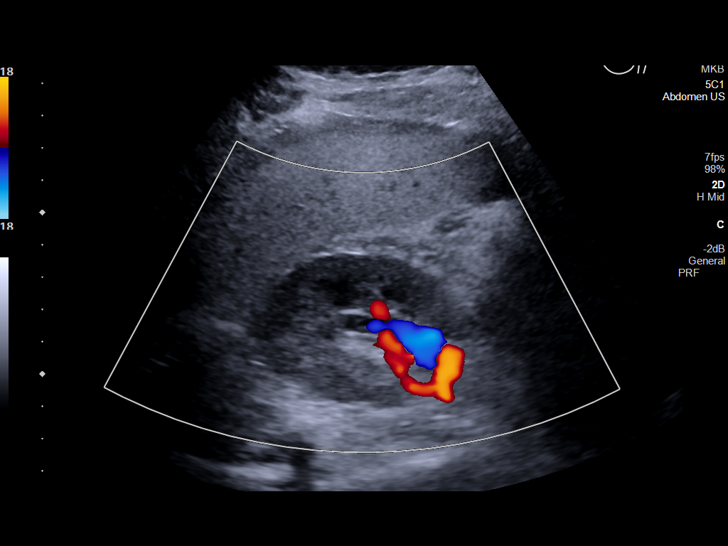
[im 90/108]
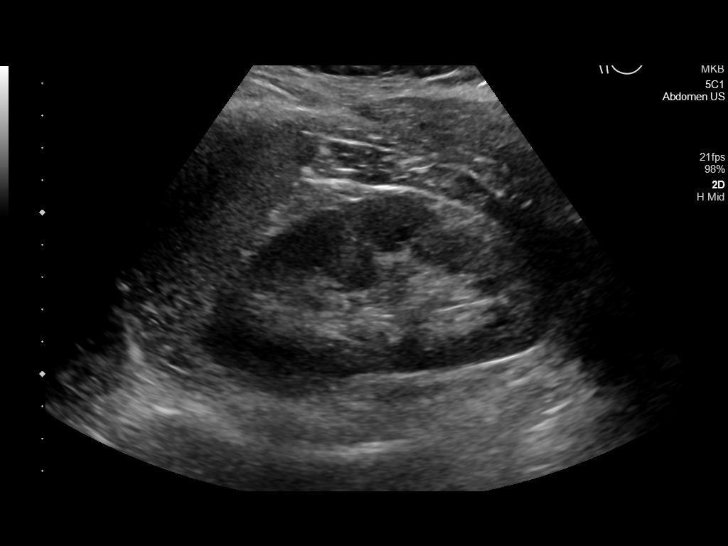
[im 99/108]
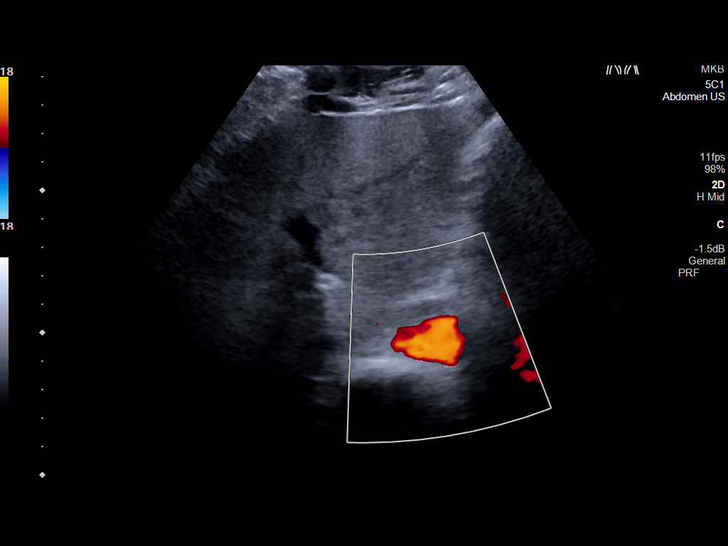
[im 108/108]
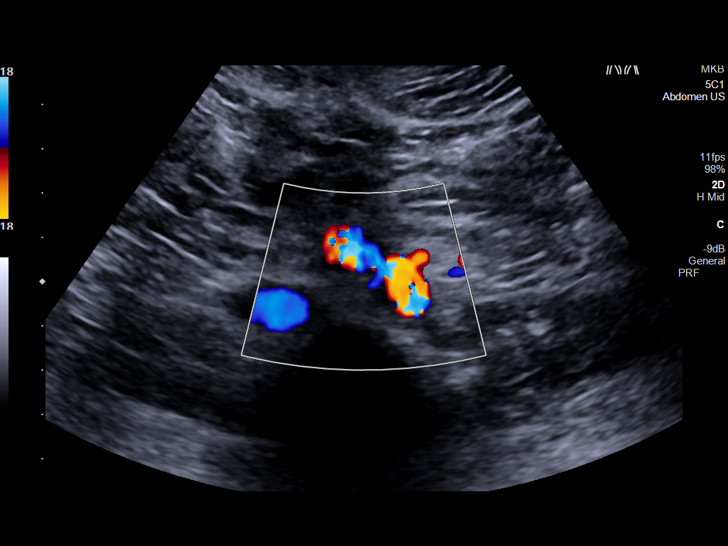

[14 of 25 positions shown; findings below may reference images not displayed]

FINDINGS: Gallbladder: No gallstones or wall thickening visualized. No
sonographic Murphy sign noted by sonographer.

Common bile duct: Diameter: 2.8 mm

Liver: Diffusely echogenic liver. Focal hypoechoic regions near the
gallbladder fossa consistent with fat sparing. Portal vein is patent
on color Doppler imaging with normal direction of blood flow towards
the liver.

IVC: No abnormality visualized.

Pancreas: Visualized portion unremarkable.

Spleen: Size and appearance within normal limits.

Right Kidney: Length: 9.7 cm. Echogenicity within normal limits. No
mass or hydronephrosis visualized.

Left Kidney: Length: 10.9 cm. Echogenicity within normal limits. No
mass or hydronephrosis visualized.

Abdominal aorta: No aneurysm visualized.

Other findings: None.
IMPRESSION: 1. Diffusely echogenic liver consistent with hepatic steatosis with
probable fat sparing near the gallbladder fossa.
2. Otherwise negative examination
